# Patient Record
Sex: Female | Born: 1995 | Race: White | Hispanic: No | Marital: Single | Smoking: Former smoker
Health system: Southern US, Community
[De-identification: ages and names within clinical notes are randomized; demographics above are authoritative.]

## PROBLEM LIST (undated history)

## (undated) DIAGNOSIS — Q512 Other doubling of uterus, unspecified: Secondary | ICD-10-CM

## (undated) DIAGNOSIS — T8859XA Other complications of anesthesia, initial encounter: Secondary | ICD-10-CM

## (undated) DIAGNOSIS — R0600 Dyspnea, unspecified: Secondary | ICD-10-CM

## (undated) DIAGNOSIS — I499 Cardiac arrhythmia, unspecified: Secondary | ICD-10-CM

## (undated) DIAGNOSIS — Q5128 Other doubling of uterus, other specified: Secondary | ICD-10-CM

## (undated) DIAGNOSIS — N390 Urinary tract infection, site not specified: Secondary | ICD-10-CM

## (undated) HISTORY — DX: Urinary tract infection, site not specified: N39.0

## (undated) HISTORY — PX: OTHER SURGICAL HISTORY: SHX169

---
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
## 2015-01-01 ENCOUNTER — Encounter: Payer: Self-pay | Admitting: Pediatrics

## 2015-01-01 ENCOUNTER — Ambulatory Visit: Payer: Self-pay | Admitting: Pediatrics

## 2015-01-01 NOTE — Progress Notes (Signed)
Pre-Visit Planning  STI screen in the past year? yes, positive chlamydia 11/13/14 Pertinent Labs? yes,  Component     Latest Ref Rng 11/15/2014  HIV     NONREACTIVE NONREACTIVE  RPR     NON REAC NON REAC  T vaginalis RNA      Negative  Organism ID, Bacteria      HERPES SIMPLEX VIRUS TYPE 1 DETECTED.   Review of previous notes:  Last seen in [facility] on 11/15/14.  Treatment plan at last visit included treatment of positive chlamydia, testing for possible HSV due presence of lesions on mons area, testing for all other STIs.  HSV culture was positive for Type 1.  Kristy Sanchez was notified by telephone.   Previous Psych Screenings?  Yes, PHQ9 06/15/14  Psych Screenings Due? no  Immunizations Due? no  To Do at visit:   - repeat urine GC/CT - consider UHCG - assess for any new HSV lesions - review dyspareunia and review ultrasound confirmed presence of bicornuate uterus - review birth control options

## 2015-01-08 ENCOUNTER — Telehealth: Payer: Self-pay

## 2015-01-08 NOTE — Telephone Encounter (Signed)
-----   Message from Cain SieveMartha Fairbanks Perry, MD sent at 01/08/2015 12:52 PM EST ----- Please call patient to schedule an appointment after she no showed recently.  Please document the call in epic.  Thanks.

## 2015-01-08 NOTE — Telephone Encounter (Signed)
[facility] and told her who I was and why I was calling and she asked if she could call me back.  I gave her my direct line number and asked that she call and make a follow up appointment with Dr. Marina GoodellPerry or Rayfield Citizenaroline.

## 2015-01-31 ENCOUNTER — Encounter: Payer: Self-pay | Admitting: Pediatrics

## 2015-01-31 NOTE — Progress Notes (Signed)
Pre-Visit Planning  Review of previous notes:  Last seen in [facility] on 11/15/14.  Treatment plan at last visit included treatment for chlamydia, STD testing including HSV culture.  Pt tested positive for HSV and was notified.   Previous Psych Screenings?  yes, PHQ9 06/15/14 Psych Screenings Due? no  STI screen in the past year? yes, but repeat indicated due to previous positive Pertinent Labs? yes,  Component     Latest Ref Rng 11/13/2014 11/15/2014  CT Probe RNA      POSITIVE (A)   GC Probe RNA      NEGATIVE   Preg Test, Ur      Negative   HIV     NONREACTIVE  NONREACTIVE  RPR     NON REAC  NON REAC  T vaginalis RNA       Negative  Organism ID, Bacteria       HERPES SIMPLEX VIRUS TYPE 1 DETECTED.   To Do at visit:   - Urine GC/CT - UHCG - Review ultrasound showing bicornuate uterus (pt had wanted this confirmed) - Review birth control options

## 2015-02-01 ENCOUNTER — Encounter: Payer: Self-pay | Admitting: Pediatrics

## 2015-02-01 ENCOUNTER — Ambulatory Visit (INDEPENDENT_AMBULATORY_CARE_PROVIDER_SITE_OTHER): Payer: Medicaid Other | Admitting: Pediatrics

## 2015-02-01 VITALS — BP 116/72 | Ht 65.5 in | Wt 128.0 lb

## 2015-02-01 DIAGNOSIS — Z3202 Encounter for pregnancy test, result negative: Secondary | ICD-10-CM

## 2015-02-01 DIAGNOSIS — Z113 Encounter for screening for infections with a predominantly sexual mode of transmission: Secondary | ICD-10-CM

## 2015-02-01 DIAGNOSIS — A749 Chlamydial infection, unspecified: Secondary | ICD-10-CM

## 2015-02-01 DIAGNOSIS — A6000 Herpesviral infection of urogenital system, unspecified: Secondary | ICD-10-CM

## 2015-02-01 DIAGNOSIS — F4321 Adjustment disorder with depressed mood: Secondary | ICD-10-CM

## 2015-02-01 LAB — POCT URINE PREGNANCY: PREG TEST UR: NEGATIVE

## 2015-02-01 NOTE — Progress Notes (Addendum)
Adolescent Medicine Consultation Follow-Up Visit  Kristy Sanchez  is a 19 y.o. female referred by Dr. Leron Croak here today for follow-up of STD testing and BC options.   PCP Confirmed?  yes  ETTEFAGH, Betti Cruz, MD   History was provided by the patient.  Previsit planning completed:  yes   Patient's private number: [phone]  Last seen in [facility] on 11/15/14. Treatment plan at last visit included treatment for chlamydia, STD testing including HSV culture. Pt tested positive for HSV 1 and was notified.   Growth Chart Viewed? yes - appears to have lost 7 lbs since last visit, likely related to stress   HPI:    Patient states she has tried depo, nuvo ring and pill in the past for birth control options. Depo gave her a period for 3 years straight, every day. When patient was on her nuvo ring, it fell out. When patient was on the pill, it was hard for her to remember to take. Last time took the pill was 5 months ago.  Patient's last menstrual period was 01/23/2015 (approximate).  Has been normal, comes every month.   Allergies  Allergen Reactions  . Hydrocodone Nausea And Vomiting  . Vicodin [Hydrocodone-Acetaminophen] Nausea And Vomiting    Social History: School/Future Plans: Has graduated, working until can get into school. Working at [employer] Living in own place.   Confidentiality was discussed with the patient, at appointment alone.  Sexually active? yes, 1 week ago. Was with ex boyfriend. Was going to work things out but couldn't. Currently plans to not be sexually active in the future due to recent HSV diagnosis.  Pregnancy Prevention: Condoms, patient is not open to any more BC options as she has heard negative stories about all of them  Safe to self? Yes, no SI or HI but has had feelings of depression recently due to HSV diagnosis. Doesn't really have anyone to talk to. Has been holding things inside mostly and has been feeling down. Dad has a history of  depression and thinks she may be experiencing some of it as well.   Review of Symptoms: General ROS: positive for - weight loss Gastrointestinal ROS: no abdominal pain, change in bowel habits, or black or bloody stools Urinary ROS: no dysuria, trouble voiding or hematuria Gyn ROS: negative for - genital discharge, irregular/heavy menses, pelvic pain and vulvar/vaginal symptoms  Physical Exam:  Filed Vitals:   02/01/15 1340  BP: 116/72  Height: 5' 5.5" (1.664 m)  Weight: 128 lb (58.06 kg)   BP 116/72 mmHg  Ht 5' 5.5" (1.664 m)  Wt 128 lb (58.06 kg)  BMI 20.97 kg/m2  LMP 01/23/2015 (Approximate) Body mass index: body mass index is 20.97 kg/(m^2). Blood pressure percentiles are 64% systolic and 70% diastolic based on 2000 NHANES data. Blood pressure percentile targets: 90: 126/80, 95: 129/84, 99 + 5 mmHg: 142/97.  Physical Exam  Gen:  Well-appearing, in no acute distress. Sitting on exam table. Nasal and tongue ring present.  HEENT:  Normocephalic, atraumatic. EOMI. MMM. Neck supple, no lymphadenopathy.   CV: Regular rate and rhythm, no murmurs rubs or gallops. PULM: Clear to auscultation bilaterally. No wheezes/rales or rhonchi ABD: Soft, non tender, non distended, normal bowel sounds.  Skin: Warm, dry, no rashes  Assessment/Plan: Patient is a 19 year old female with a history of HSV and treated chlamydia who comes for follow up. Patient is having a difficult time dealing with diagnosis of HSV but no current SI or HI.  Patient will benefit from talking with someone, spoke with MaliJasmine (behavioral health counselor) about meeting with patient who is agreeable. Also discussed importance of patient having own person at home to talk to.  1. Adjustment disorder with depressed mood - Ambulatory referral to Social Work - weight loss is likely due to stress as well, will FU at next visit   2. Previous chlamydia infection Previous infection on 11/17, treated Retested today, will FU on  results of that and Gonorrhea Discussed the importance of condoms for protection. Urine pregnancy test negative today. Patient declined any other forms of BC, educated on different options.  3. Genital herpes simplex type 1 infection Given information from CDC about infection for education benefit   4. History of bicornuate uterus - Reviewed with patient ultrasound showing bicornuate uterus  Follow-up:  3 months

## 2015-02-01 NOTE — Progress Notes (Signed)
Attending Co-Signature.  I saw and evaluated the patient, performing the key elements of the service.  I developed the management plan that is described in the resident's note, and I agree with the content.  19 yo female s/p chlamydia treatment and positive HSV.  Ready to review to birth control options.  Was not satisfied with depo, pill, or ring.  Declines birth control except for condoms.  Review HSV1.  Struggling with diagnosis of HSV.  Weight loss noted.  She agrees it is stress related.  Reviewed pelvic ultrasound. - Urine HCG NEG - Urine GC/CT, urine trichomonas ordered - Patient and/or legal guardian verbally consented to meet with Behavioral Health Clinician about presenting concerns.   Cain SievePERRY, Oveda Dadamo FAIRBANKS, MD Adolescent Medicine Specialist

## 2015-02-02 ENCOUNTER — Encounter: Payer: Self-pay | Admitting: Pediatrics

## 2015-02-02 LAB — GC/CHLAMYDIA PROBE AMP, URINE
Chlamydia, Swab/Urine, PCR: NEGATIVE
GC Probe Amp, Urine: NEGATIVE

## 2015-02-05 ENCOUNTER — Institutional Professional Consult (permissible substitution): Payer: Medicaid Other | Admitting: Clinical

## 2015-02-05 ENCOUNTER — Telehealth: Payer: Self-pay | Admitting: Clinical

## 2015-02-05 NOTE — Telephone Encounter (Signed)
[facility].  This Behavioral Health Clinician left a message to call back with name & contact information.

## 2015-02-11 ENCOUNTER — Telehealth: Payer: Self-pay | Admitting: Clinical

## 2015-02-11 NOTE — Telephone Encounter (Signed)
No answer. This Behavioral Health Clinician left a message to call back with name & contact information if she wants to reschedule or wants information.  PLAN: At this time, [facility] will wait for patient to call back or follow up with patient at her next follow up visit with Dr. Marina GoodellPerry in May 2016.

## 2015-02-11 NOTE — Telephone Encounter (Signed)
[facility]ancie [facility] back and asked to speak with Dr. Marina GoodellPerry.  [facility] informed her that Dr. Marina GoodellPerry would not be available until Thursday of this week.  [facility] if [facility] could help her or schedule an appointment to discuss strategies with her.  [place] declined scheduling an appointment and stated she's doing better.  Takina asked if Dr. Marina GoodellPerry could call her back when she returns but did not want to leave any detailed information.  [facility] did offer to have her talk to C. Maxwell CaulHacker but [place] wanted to talk to Dr. Marina GoodellPerry directly.

## 2015-02-16 ENCOUNTER — Encounter (HOSPITAL_COMMUNITY): Payer: Self-pay | Admitting: *Deleted

## 2015-02-16 ENCOUNTER — Emergency Department (HOSPITAL_COMMUNITY)
Admission: EM | Admit: 2015-02-16 | Discharge: 2015-02-16 | Disposition: A | Discharge status: Home / Self Care | Payer: Medicaid Other | Attending: Emergency Medicine | Admitting: Emergency Medicine

## 2015-02-16 ENCOUNTER — Emergency Department (HOSPITAL_COMMUNITY): Payer: Medicaid Other

## 2015-02-16 DIAGNOSIS — Z87440 Personal history of urinary (tract) infections: Secondary | ICD-10-CM | POA: Diagnosis not present

## 2015-02-16 DIAGNOSIS — R102 Pelvic and perineal pain: Secondary | ICD-10-CM

## 2015-02-16 DIAGNOSIS — Z3202 Encounter for pregnancy test, result negative: Secondary | ICD-10-CM | POA: Diagnosis not present

## 2015-02-16 DIAGNOSIS — N9489 Other specified conditions associated with female genital organs and menstrual cycle: Secondary | ICD-10-CM | POA: Diagnosis not present

## 2015-02-16 DIAGNOSIS — Z87891 Personal history of nicotine dependence: Secondary | ICD-10-CM | POA: Insufficient documentation

## 2015-02-16 LAB — URINALYSIS, ROUTINE W REFLEX MICROSCOPIC
Bilirubin Urine: NEGATIVE
Glucose, UA: NEGATIVE mg/dL
HGB URINE DIPSTICK: NEGATIVE
Ketones, ur: NEGATIVE mg/dL
Leukocytes, UA: NEGATIVE
NITRITE: NEGATIVE
PROTEIN: NEGATIVE mg/dL
Specific Gravity, Urine: 1.024 (ref 1.005–1.030)
Urobilinogen, UA: 1 mg/dL (ref 0.0–1.0)
pH: 6.5 (ref 5.0–8.0)

## 2015-02-16 LAB — POC URINE PREG, ED: PREG TEST UR: NEGATIVE

## 2015-02-16 LAB — WET PREP, GENITAL
Clue Cells Wet Prep HPF POC: NONE SEEN
TRICH WET PREP: NONE SEEN
Yeast Wet Prep HPF POC: NONE SEEN

## 2015-02-16 MED ORDER — OXYCODONE-ACETAMINOPHEN 5-325 MG PO TABS: 1.0000 | ORAL_TABLET | Freq: Four times a day (QID) | ORAL | Status: DC | PRN

## 2015-02-16 NOTE — Discharge Instructions (Signed)
Pelvic Pain Female pelvic pain can be caused by many different things and start from a variety of places. Pelvic pain refers to pain that is located in the lower half of the abdomen and between your hips. The pain may occur over a short period of time (acute) or may be reoccurring (chronic). The cause of pelvic pain may be related to disorders affecting the female reproductive organs (gynecologic), but it may also be related to the bladder, kidney stones, an intestinal complication, or muscle or skeletal problems. Getting help right away for pelvic pain is important, especially if there has been severe, sharp, or a sudden onset of unusual pain. It is also important to get help right away because some types of pelvic pain can be life threatening.  CAUSES  Below are only some of the causes of pelvic pain. The causes of pelvic pain can be in one of several categories.   Gynecologic.  Pelvic inflammatory disease.  Sexually transmitted infection.  Ovarian cyst or a twisted ovarian ligament (ovarian torsion).  Uterine lining that grows outside the uterus (endometriosis).  Fibroids, cysts, or tumors.  Ovulation.  Pregnancy.  Pregnancy that occurs outside the uterus (ectopic pregnancy).  Miscarriage.  Labor.  Abruption of the placenta or ruptured uterus.  Infection.  Uterine infection (endometritis).  Bladder infection.  Diverticulitis.  Miscarriage related to a uterine infection (septic abortion).  Bladder.  Inflammation of the bladder (cystitis).  Kidney stone(s).  Gastrointestinal.  Constipation.  Diverticulitis.  Neurologic.  Trauma.  Feeling pelvic pain because of mental or emotional causes (psychosomatic).  Cancers of the bowel or pelvis. EVALUATION  Your caregiver will want to take a careful history of your concerns. This includes recent changes in your health, a careful gynecologic history of your periods (menses), and a sexual history. Obtaining your family  history and medical history is also important. Your caregiver may suggest a pelvic exam. A pelvic exam will help identify the location and severity of the pain. It also helps in the evaluation of which organ system may be involved. In order to identify the cause of the pelvic pain and be properly treated, your caregiver may order tests. These tests may include:   A pregnancy test.  Pelvic ultrasonography.  An X-ray exam of the abdomen.  A urinalysis or evaluation of vaginal discharge.  Blood tests. HOME CARE INSTRUCTIONS   Only take over-the-counter or prescription medicines for pain, discomfort, or fever as directed by your caregiver.   Rest as directed by your caregiver.   Eat a balanced diet.   Drink enough fluids to make your urine clear or pale yellow, or as directed.   Avoid sexual intercourse if it causes pain.   Apply warm or cold compresses to the lower abdomen depending on which one helps the pain.   Avoid stressful situations.   Keep a journal of your pelvic pain. Write down when it started, where the pain is located, and if there are things that seem to be associated with the pain, such as food or your menstrual cycle.  Follow up with your caregiver as directed.  SEEK MEDICAL CARE IF:  Your medicine does not help your pain.  You have abnormal vaginal discharge. SEEK IMMEDIATE MEDICAL CARE IF:   You have heavy bleeding from the vagina.   Your pelvic pain increases.   You feel light-headed or faint.   You have chills.   You have pain with urination or blood in your urine.   You have uncontrolled diarrhea   or vomiting.   You have a fever or persistent symptoms for more than 3 days.  You have a fever and your symptoms suddenly get worse.   You are being physically or sexually abused.  MAKE SURE YOU:  Understand these instructions.  Will watch your condition.  Will get help if you are not doing well or get worse. Document Released:  11/10/2004 Document Revised: 04/30/2014 Document Reviewed: 04/04/2012 ExitCare Patient Information 2015 [employer]. This information is not intended to replace advice given to you by your health care provider. Make sure you discuss any questions you have with your health care provider.  

## 2015-02-16 NOTE — ED Notes (Signed)
Pt reports unable to obtain urine sample at triage. 

## 2015-02-16 NOTE — ED Notes (Signed)
Pt reports lower abd pain, n/v and feeling lightheaded. Unsure of last menstrual. Pt denies any vaginal or urinary symptoms.

## 2015-02-16 NOTE — ED Provider Notes (Signed)
CSN: [id number]     Arrival date & time 02/16/15  1255 History   First MD Initiated Contact with Patient 02/16/15 1317     Chief Complaint  Patient presents with  . Pelvic Pain  . Nausea      HPI Pt was seen at 1330.  Per pt, c/o gradual onset and persistence of constant pelvic "pain" for the past 4 days. Has been associated with nausea and a small amount of vaginal bleeding. Pt states she has irregular menses and "it's sure if that's what it is." Describes the abd pain as "cramping."  Denies vomiting/diarrhea, no fevers, no back pain, no rash, no CP/SOB, no black or blood in stools.       Past Medical History  Diagnosis Date  . 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 UTI (urinary tract infection) 04/23/2014    85K CFU's of Klebsiella, treated with Augmentin x 10 days    Past Surgical History  Procedure Laterality Date  . Dental surgery  03/23/14    wisdom teeth extraction   Family History  Problem Relation Age of Onset  . Dementia Father   . Depression Father   . Cancer Paternal Uncle   . Cancer Maternal Grandmother     leukemia  . Heart disease Paternal Grandmother     arrhythmia  . Diabetes Paternal Grandfather    History  Substance Use Topics  . Smoking status: Former Smoker    Types: Cigarettes  . Smokeless tobacco: Not on file     Comment: ttrying to stop  . Alcohol Use: Not on file    Review of Systems ROS: Statement: All systems negative except as marked or noted in the HPI; Constitutional: Negative for fever and chills. ; ; Eyes: Negative for eye pain, redness and discharge. ; ; ENMT: Negative for ear pain, hoarseness, nasal congestion, sinus pressure and sore throat. ; ; Cardiovascular: Negative for chest pain, palpitations, diaphoresis, dyspnea and peripheral edema. ; ; Respiratory: Negative for cough, wheezing and stridor. ; ; Gastrointestinal: +nausea. Negative for vomiting, no diarrhea, abdominal  pain, blood in stool, hematemesis, jaundice and rectal bleeding. . ; ; Genitourinary: Negative for dysuria, flank pain and hematuria. ; ; GYN:  +pelvic pain, +vaginal bleeding. No vaginal discharge, no vulvar pain. ;; Musculoskeletal: Negative for back pain and neck pain. Negative for swelling and trauma.; ; Skin: Negative for pruritus, rash, abrasions, blisters, bruising and skin lesion.; ; Neuro: Negative for headache, lightheadedness and neck stiffness. Negative for weakness, altered level of consciousness , altered mental status, extremity weakness, paresthesias, involuntary movement, seizure and syncope.      Allergies  Hydrocodone and Vicodin  Home Medications   Prior to Admission medications   Medication Sig Start Date End Date Taking? Authorizing Provider  mupirocin ointment (BACTROBAN) 2 % Apply 1 application topically 2 (two) times daily. Use until razor burn is gone, then restart as needed. 11/13/14   Cain Sieve, MD   BP 112/65 mmHg  Pulse 76  Temp(Src) 98.6 F (37 C) (Oral)  Resp 10  Ht  (1.651 m)  Wt 120 lb (54.432 kg)  BMI 19.97 kg/m2  SpO2 99%  LMP 01/23/2015 (LMP Unknown) Physical Exam  1335: Physical examination:  Nursing notes reviewed; Vital signs and O2 SAT reviewed;  Constitutional: Well developed, Well nourished, Well hydrated, In no acute distress; Head:  Normocephalic, atraumatic; Eyes: EOMI, PERRL, No scleral icterus; ENMT: Mouth and  pharynx normal, Mucous membranes moist; Neck: Supple, Full range of motion, No lymphadenopathy; Cardiovascular: Regular rate and rhythm, No murmur, rub, or gallop; Respiratory: Breath sounds clear & equal bilaterally, No rales, rhonchi, wheezes.  Speaking full sentences with ease, Normal respiratory effort/excursion; Chest: Nontender, Movement normal; Abdomen: Soft, +mild suprapubic tenderness to palp. No rebound or guarding. Nondistended, Normal bowel sounds; Genitourinary: No CVA tenderness. Pelvic exam performed with  permission of pt and female ED RN assist during exam.  External genitalia w/o lesions. Vaginal vault without discharge, small amount of dark red blood in vault.  Cervix w/o lesions, not friable, GC/chlam and wet prep obtained and sent to lab.  Bimanual exam w/o CMT. +mild suprapubic and bilateral adnexal tenderness.;; Extremities: Pulses normal, No tenderness, No edema, No calf edema or asymmetry.; Neuro: AA&Ox3, Major CN grossly intact.  Speech clear. No gross focal motor or sensory deficits in extremities. Climbs on and off stretcher easily by herself. Gait steady.; Skin: Color normal, Warm, Dry.   ED Course  Procedures     EKG Interpretation None      MDM  MDM Reviewed: previous chart, nursing note and vitals Reviewed previous: labs Interpretation: labs   Results for orders placed or performed during the hospital encounter of 02/16/15  Wet prep, genital  Result Value Ref Range   Yeast Wet Prep HPF POC NONE SEEN NONE SEEN   Trich, Wet Prep NONE SEEN NONE SEEN   Clue Cells Wet Prep HPF POC NONE SEEN NONE SEEN   WBC, Wet Prep HPF POC FEW (A) NONE SEEN  Urinalysis, Routine w reflex microscopic  Result Value Ref Range   Color, Urine YELLOW YELLOW   APPearance CLEAR CLEAR   Specific Gravity, Urine 1.024 1.005 - 1.030   pH 6.5 5.0 - 8.0   Glucose, UA NEGATIVE NEGATIVE mg/dL   Hgb urine dipstick NEGATIVE NEGATIVE   Bilirubin Urine NEGATIVE NEGATIVE   Ketones, ur NEGATIVE NEGATIVE mg/dL   Protein, ur NEGATIVE NEGATIVE mg/dL   Urobilinogen, UA 1.0 0.0 - 1.0 mg/dL   Nitrite NEGATIVE NEGATIVE   Leukocytes, UA NEGATIVE NEGATIVE  POC Urine Pregnancy, ED  (If Pre-menopausal female) - [facility]  Result Value Ref Range   Preg Test, Ur NEGATIVE NEGATIVE     1620:  US pelvis pending. Sign out to Dr. Gwendolyn Sanchez.       Kristy JesterKathleen Avangelina Sanchez, Kristy Sanchez 02/16/15 1621

## 2015-02-16 NOTE — ED Notes (Signed)
Patient transported to Ultrasound 

## 2015-02-16 NOTE — ED Provider Notes (Signed)
1600 - Care from Dr. Clarene DukeMcManus. Here with pelvic pain on exam. Awaiting US. US shows pelvic congestion syndrome. Will have her f/u with OB. Given pain meds. No evidence of torsion  1. Pelvic pain in female      Elwin MochaBlair Janeisha Ryle, MD 02/16/15 [phone]

## 2015-02-18 LAB — GC/CHLAMYDIA PROBE AMP (~~LOC~~) NOT AT ARMC
Chlamydia: NEGATIVE
NEISSERIA GONORRHEA: NEGATIVE

## 2015-02-19 NOTE — Telephone Encounter (Signed)
Spoke with patient who stated she would like to schedule f/u after recent ER visit and to discuss other issues previously addressed at visits.  Pt given appt for Feb 29 at 8:30 AM.

## 2015-02-25 ENCOUNTER — Ambulatory Visit: Payer: Medicaid Other | Admitting: Pediatrics

## 2015-02-25 ENCOUNTER — Telehealth: Payer: Self-pay

## 2015-02-25 NOTE — Telephone Encounter (Signed)
Pt missed the appt that was scheduled this morning at 8:30 AM.  Please schedule her with next available appt and put her on waiting list for cancellations.

## 2015-02-25 NOTE — Telephone Encounter (Signed)
Pt called this morning and would like to see you today.

## 2015-02-25 NOTE — Telephone Encounter (Signed)
Pt called back, VM left asking pt to call and schedule a f/u apt with Dr. Marina GoodellPerry. Advised that we will put her on a  waiting list for cancellations. Callback number provided.

## 2015-03-20 ENCOUNTER — Ambulatory Visit (INDEPENDENT_AMBULATORY_CARE_PROVIDER_SITE_OTHER): Payer: Medicaid Other | Admitting: Pediatrics

## 2015-03-20 ENCOUNTER — Encounter: Payer: Self-pay | Admitting: Pediatrics

## 2015-03-20 ENCOUNTER — Encounter (INDEPENDENT_AMBULATORY_CARE_PROVIDER_SITE_OTHER): Payer: Self-pay

## 2015-03-20 VITALS — BP 114/64 | Ht 65.59 in | Wt 125.8 lb

## 2015-03-20 DIAGNOSIS — Q513 Bicornate uterus: Secondary | ICD-10-CM | POA: Diagnosis not present

## 2015-03-20 DIAGNOSIS — G8929 Other chronic pain: Secondary | ICD-10-CM

## 2015-03-20 DIAGNOSIS — Z111 Encounter for screening for respiratory tuberculosis: Secondary | ICD-10-CM

## 2015-03-20 DIAGNOSIS — N926 Irregular menstruation, unspecified: Secondary | ICD-10-CM | POA: Diagnosis not present

## 2015-03-20 DIAGNOSIS — M542 Cervicalgia: Secondary | ICD-10-CM

## 2015-03-20 DIAGNOSIS — N949 Unspecified condition associated with female genital organs and menstrual cycle: Secondary | ICD-10-CM | POA: Diagnosis not present

## 2015-03-20 DIAGNOSIS — R102 Pelvic and perineal pain: Principal | ICD-10-CM

## 2015-03-20 MED ORDER — TUBERCULIN PPD 5 UNIT/0.1ML ID SOLN
5.0000 [IU] | Freq: Once | INTRADERMAL | Status: AC
  Administered 2015-03-20: 5 [IU] via INTRADERMAL

## 2015-03-20 NOTE — Patient Instructions (Addendum)
We have placed your TB tests today in both arms. Come back to see Melissa on Friday to have them read. If you miss coming on Friday, we will have to repeat the process over again.   You already had your flu shot this year and we provided you with the documentation.   We will refer you to Dr. Renae FicklePaul at [facility] for your neck and back pain. He can usually get people in fairly quickly.
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
## 2015-03-22 ENCOUNTER — Ambulatory Visit: Payer: Medicaid Other | Admitting: *Deleted

## 2015-03-22 ENCOUNTER — Encounter (INDEPENDENT_AMBULATORY_CARE_PROVIDER_SITE_OTHER): Payer: Self-pay

## 2015-03-22 ENCOUNTER — Ambulatory Visit: Payer: Self-pay | Admitting: *Deleted

## 2015-03-22 LAB — TB SKIN TEST
Induration: 0 mm
Induration: 2.5 mm
TB SKIN TEST: NEGATIVE
TB Skin Test: NEGATIVE

## 2015-03-22 NOTE — Addendum Note (Signed)
Addended by: Elenora GammaFEENY, Adrinne Sze E on: 03/22/2015 04:44 PM   Modules accepted: Orders

## 2015-04-08 ENCOUNTER — Telehealth: Payer: Self-pay | Admitting: *Deleted

## 2015-04-08 NOTE — Telephone Encounter (Signed)
Kristy Sanchez called in and needed a different time for her appointment tomorrow 4/12, she was advised we did not have anything until later this month. She needs an appointment sooner than the May 10th one available with Dr. Marina GoodellPerry. Please call her [phone].

## 2015-04-09 ENCOUNTER — Ambulatory Visit: Payer: Medicaid Other | Admitting: Pediatrics

## 2015-05-02 ENCOUNTER — Encounter: Payer: Medicaid Other | Admitting: Clinical

## 2015-05-02 ENCOUNTER — Ambulatory Visit: Payer: Medicaid Other | Admitting: Pediatrics

## 2015-05-07 ENCOUNTER — Encounter: Payer: Self-pay | Admitting: Pediatrics

## 2015-05-07 ENCOUNTER — Ambulatory Visit (INDEPENDENT_AMBULATORY_CARE_PROVIDER_SITE_OTHER): Payer: Medicaid Other | Admitting: Pediatrics

## 2015-05-07 ENCOUNTER — Encounter (INDEPENDENT_AMBULATORY_CARE_PROVIDER_SITE_OTHER): Payer: Self-pay

## 2015-05-07 VITALS — BP 108/70 | Ht 66.14 in | Wt 127.4 lb

## 2015-05-07 DIAGNOSIS — Z3202 Encounter for pregnancy test, result negative: Secondary | ICD-10-CM | POA: Diagnosis not present

## 2015-05-07 DIAGNOSIS — R102 Pelvic and perineal pain: Secondary | ICD-10-CM

## 2015-05-07 DIAGNOSIS — Z3046 Encounter for surveillance of implantable subdermal contraceptive: Secondary | ICD-10-CM | POA: Insufficient documentation

## 2015-05-07 DIAGNOSIS — Z113 Encounter for screening for infections with a predominantly sexual mode of transmission: Secondary | ICD-10-CM | POA: Diagnosis not present

## 2015-05-07 DIAGNOSIS — Z309 Encounter for contraceptive management, unspecified: Secondary | ICD-10-CM

## 2015-05-07 DIAGNOSIS — Z30430 Encounter for insertion of intrauterine contraceptive device: Secondary | ICD-10-CM

## 2015-05-07 DIAGNOSIS — G8929 Other chronic pain: Secondary | ICD-10-CM

## 2015-05-07 DIAGNOSIS — Z789 Other specified health status: Secondary | ICD-10-CM

## 2015-05-07 DIAGNOSIS — Z30017 Encounter for initial prescription of implantable subdermal contraceptive: Secondary | ICD-10-CM

## 2015-05-07 DIAGNOSIS — Q513 Bicornate uterus: Secondary | ICD-10-CM

## 2015-05-07 LAB — POCT URINE PREGNANCY: PREG TEST UR: NEGATIVE

## 2015-05-07 LAB — POCT RAPID HIV: RAPID HIV, POC: NEGATIVE

## 2015-05-07 MED ORDER — NON FORMULARY
1.5000 mg | Freq: Once | Status: AC
  Administered 2015-05-07: 1.5 mg via ORAL

## 2015-05-07 MED ORDER — ETONOGESTREL 68 MG ~~LOC~~ IMPL
68.0000 mg | DRUG_IMPLANT | Freq: Once | SUBCUTANEOUS | Status: AC
  Administered 2015-05-07: 68 mg via SUBCUTANEOUS
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
## 2015-05-07 NOTE — Procedures (Signed)
Nexplanon Insertion  No contraindications for placement.  No liver disease, no unexplained vaginal bleeding, no h/o breast cancer, no h/o blood clots.  Patient's last menstrual period was 04/29/2015 (approximate).  UHCG: NEG  Last Unprotected sex:  <24 hours, therefore Plan B administered in clinic  Risks & benefits of Nexplanon discussed The nexplanon device was purchased and [facility] by [facility]. Packaging instructions supplied to patient Consent form signed  The patient denies any allergies to anesthetics or antiseptics.  Procedure: Pt was placed in supine position. Left arm was flexed at the elbow and externally rotated so that her wrist was parallel to her ear The medial epicondyle of the left arm was identified The insertions site was marked 8 cm proximal to the medial epicondyle The insertion site was cleaned with Betadine The area surrounding the insertion site was covered with a sterile drape 1% lidocaine was injected just under the skin at the insertion site extending 4 cm proximally. The sterile preloaded disposable Nexaplanon applicator was removed from the sterile packaging The applicator needle was inserted at a 30 degree angle at 8 cm proximal to the medial epicondyle as marked The applicator was lowered to a horizontal position and advanced just under the skin for the full length of the needle The slider on the applicator was retracted fully while the applicator remained in the same position, then the applicator was removed. The implant was confirmed via palpation as being in position The implant position was demonstrated to the patient Pressure dressing was applied to the patient.  The patient was instructed to removed the pressure dressing in 24 hrs.  The patient was advised to move slowly from a supine to an upright position  The patient denied any concerns or complaints  The patient was instructed to schedule a follow-up appt in 1 month and to call sooner if  any concerns.  The patient acknowledged agreement and understanding of the plan.

## 2015-05-07 NOTE — Patient Instructions (Signed)
Follow-up with Dr. Ailany Koren in 1 month. Schedule this appointment before you leave clinic today.  Congratulations on getting your Nexplanon placement!  Below is some important information about Nexplanon.  First remember that Nexplanon does not prevent sexually transmitted infections.  Condoms will help prevent sexually transmitted infections. The Nexplanon starts working 7 days after it was inserted.  There is a risk of getting pregnant if you have unprotected sex in those first 7 days after placement of the Nexplanon.  The Nexplanon lasts for 3 years but can be removed at any time.  You can become pregnant as early as 1 week after removal.  You can have a new Nexplanon put in after the old one is removed if you like.  It is not known whether Nexplanon is as effective in women who are very overweight because the studies did not include many overweight women.  Nexplanon interacts with some medications, including barbiturates, bosentan, carbamazepine, felbamate, griseofulvin, oxcarbazepine, phenytoin, rifampin, St. John's wort, topiramate, HIV medicines.  Please alert your doctor if you are on any of these medicines.  Always tell other healthcare providers that you have a Nexplanon in your arm.  The Nexplanon was placed just under the skin.  Leave the outside bandage on for 24 hours.  Leave the smaller bandage on for 3-5 days or until it falls off on its own.  Keep the area clean and dry for 3-5 days. There is usually bruising or swelling at the insertion site for a few days to a week after placement.  If you see redness or pus draining from the insertion site, call us immediately.  Keep your user card with the date the implant was placed and the date the implant is to be removed.  The most common side effect is a change in your menstrual bleeding pattern.   This bleeding is generally not harmful to you but can be annoying.  Call or come in to see us if you have any concerns about the bleeding or if  you have any side effects or questions.    We will call you in 1 week to check in and we would like you to return to the clinic for a follow-up visit in 1 month.  You can call [facility] 24 hours a day with any questions or concerns.  There is always a nurse or doctor available to take your call.  Call 9-1-1 if you have a life-threatening emergency.  For anything else, please call us at [phone] before heading to the ER.  

## 2015-05-08 ENCOUNTER — Encounter: Payer: Self-pay | Admitting: Pediatrics

## 2015-05-08 LAB — GC/CHLAMYDIA PROBE AMP, URINE
Chlamydia, Swab/Urine, PCR: NEGATIVE
GC Probe Amp, Urine: NEGATIVE

## 2015-05-08 LAB — RPR

## 2015-05-21 ENCOUNTER — Encounter: Payer: Self-pay | Admitting: Family Medicine

## 2015-05-21 ENCOUNTER — Ambulatory Visit (INDEPENDENT_AMBULATORY_CARE_PROVIDER_SITE_OTHER): Payer: Medicaid Other | Admitting: Family Medicine

## 2015-05-21 VITALS — BP 106/64 | HR 72 | Wt 130.0 lb

## 2015-05-21 DIAGNOSIS — R102 Pelvic and perineal pain: Principal | ICD-10-CM

## 2015-05-21 DIAGNOSIS — G8929 Other chronic pain: Secondary | ICD-10-CM | POA: Diagnosis not present

## 2015-05-21 DIAGNOSIS — N949 Unspecified condition associated with female genital organs and menstrual cycle: Secondary | ICD-10-CM | POA: Diagnosis not present

## 2015-05-21 NOTE — Assessment & Plan Note (Signed)
Lengthy discussion around possible etiologies and treatment options.  Also, since pain is not present right now, might be responding to contraceptive method. Patient strongly desires definitive diagnosis since it tends to wax and wane. Given chronicity, will book for diagnostic laparoscopy to evaluate chronic PID vs. Endometriosis. Also, pt. Is concerned she might have infertility related to pain, and tubes would be able to be assessed visually at time of surgery. If this is negative, could consider interventional radiology for treatment of probable pelvic congestion syndrome.  Risks of surgery include but are not limited to bleeding, infection, injury to surrounding structures, including bowel, bladder and ureters, blood clots, and death.  Likelihood of success is high.

## 2015-05-21 NOTE — Progress Notes (Signed)
    Subjective:    Patient ID: Kristy Sanchez is a 19 y.o. female presenting with Pelvic Pain  on 05/21/2015  HPI: Pt. Is referred to me from Dr. Delorse LekMartha Perry in the adolescent medicine clinic for pelvic pain. Pt. Reports Menarche at age 19. Pain began 1 year ago. Pain comes and goes and is in lower abdomen. Pain is with cycle and without cycle. Possibly less with cycle. Pain is worse when lying down and with certain sexual positions. Pain is now absent x 2 wks.  She has h/o irregular cycles following Depo and had Nexplanon placed 2 wks ago. Hs not had a cycle since getting her Nexplanon placed. Had chlamydia in 11/15, but TOC has been negative.  Denies fever or chills. Occasional nausea but no emesis. Had pelvic sonogram which revealed some vascular congestion on left, but was otherwise normal. Also reports unprotected intercourse and no pregnancy.  She is very interested in obtaining a diagnosis.   Review of Systems  Constitutional: Negative for fever and chills.  Respiratory: Negative for shortness of breath.   Cardiovascular: Negative for chest pain.  Gastrointestinal: Positive for nausea. Negative for vomiting and abdominal pain.  Genitourinary: Positive for menstrual problem and pelvic pain. Negative for dysuria and vaginal bleeding.  Skin: Negative for rash.      Objective:    BP 106/64 mmHg  Pulse 72  Wt 130 lb (58.968 kg)  LMP 04/29/2015 (Approximate) Physical Exam  Constitutional: She is oriented to person, place, and time. She appears well-developed and well-nourished. No distress.  HENT:  Head: Normocephalic and atraumatic.  Eyes: No scleral icterus.  Neck: Neck supple.  Cardiovascular: Normal rate.   Pulmonary/Chest: Effort normal.  Abdominal: Soft.  Genitourinary:  BUS normal, vagina is pink and rugated, cervix is nulliparous without lesion, uterus is small and anteverted, no adnexal mass or tenderness. There is really very little pelvic tenderness.   Neurological:  She is alert and oriented to person, place, and time.  Skin: Skin is warm and dry.  Psychiatric: She has a normal mood and affect.        Assessment & Plan:   Problem List Items Addressed This Visit      Unprioritized   Chronic pelvic pain in female - Primary    Lengthy discussion around possible etiologies and treatment options.  Also, since pain is not present right now, might be responding to contraceptive method. Patient strongly desires definitive diagnosis since it tends to wax and wane. Given chronicity, will book for diagnostic laparoscopy to evaluate chronic PID vs. Endometriosis. Also, pt. Is concerned she might have infertility related to pain, and tubes would be able to be assessed visually at time of surgery. If this is negative, could consider interventional radiology for treatment of probable pelvic congestion syndrome.  Risks of surgery include but are not limited to bleeding, infection, injury to surrounding structures, including bowel, bladder and ureters, blood clots, and death.  Likelihood of success is high.          Return in about 3 months (around 08/21/2015) for postop check.   Freddie Nghiem S 05/21/2015 11:03 AM

## 2015-05-21 NOTE — Patient Instructions (Signed)
Pelvic Pain Female pelvic pain can be caused by many different things and start from a variety of places. Pelvic pain refers to pain that is located in the lower half of the abdomen and between your hips. The pain may occur over a short period of time (acute) or may be reoccurring (chronic). The cause of pelvic pain may be related to disorders affecting the female reproductive organs (gynecologic), but it may also be related to the bladder, kidney stones, an intestinal complication, or muscle or skeletal problems. Getting help right away for pelvic pain is important, especially if there has been severe, sharp, or a sudden onset of unusual pain. It is also important to get help right away because some types of pelvic pain can be life threatening.  CAUSES  Below are only some of the causes of pelvic pain. The causes of pelvic pain can be in one of several categories.   Gynecologic.  Pelvic inflammatory disease.  Sexually transmitted infection.  Ovarian cyst or a twisted ovarian ligament (ovarian torsion).  Uterine lining that grows outside the uterus (endometriosis).  Fibroids, cysts, or tumors.  Ovulation.  Pregnancy.  Pregnancy that occurs outside the uterus (ectopic pregnancy).  Miscarriage.  Labor.  Abruption of the placenta or ruptured uterus.  Infection.  Uterine infection (endometritis).  Bladder infection.  Diverticulitis.  Miscarriage related to a uterine infection (septic abortion).  Bladder.  Inflammation of the bladder (cystitis).  Kidney stone(s).  Gastrointestinal.  Constipation.  Diverticulitis.  Neurologic.  Trauma.  Feeling pelvic pain because of mental or emotional causes (psychosomatic).  Cancers of the bowel or pelvis. EVALUATION  Your caregiver will want to take a careful history of your concerns. This includes recent changes in your health, a careful gynecologic history of your periods (menses), and a sexual history. Obtaining your family  history and medical history is also important. Your caregiver may suggest a pelvic exam. A pelvic exam will help identify the location and severity of the pain. It also helps in the evaluation of which organ system may be involved. In order to identify the cause of the pelvic pain and be properly treated, your caregiver may order tests. These tests may include:   A pregnancy test.  Pelvic ultrasonography.  An X-ray exam of the abdomen.  A urinalysis or evaluation of vaginal discharge.  Blood tests. HOME CARE INSTRUCTIONS   Only take over-the-counter or prescription medicines for pain, discomfort, or fever as directed by your caregiver.   Rest as directed by your caregiver.   Eat a balanced diet.   Drink enough fluids to make your urine clear or pale yellow, or as directed.   Avoid sexual intercourse if it causes pain.   Apply warm or cold compresses to the lower abdomen depending on which one helps the pain.   Avoid stressful situations.   Keep a journal of your pelvic pain. Write down when it started, where the pain is located, and if there are things that seem to be associated with the pain, such as food or your menstrual cycle.  Follow up with your caregiver as directed.  SEEK MEDICAL CARE IF:  Your medicine does not help your pain.  You have abnormal vaginal discharge. SEEK IMMEDIATE MEDICAL CARE IF:   You have heavy bleeding from the vagina.   Your pelvic pain increases.   You feel light-headed or faint.   You have chills.   You have pain with urination or blood in your urine.   You have uncontrolled diarrhea   or vomiting.   You have a fever or persistent symptoms for more than 3 days.  You have a fever and your symptoms suddenly get worse.   You are being physically or sexually abused.  MAKE SURE YOU:  Understand these instructions.  Will watch your condition.  Will get help if you are not doing well or get worse. Document Released:  11/10/2004 Document Revised: 04/30/2014 Document Reviewed: 04/04/2012 ExitCare Patient Information 2015 [employer]. This information is not intended to replace advice given to you by your health care provider. Make sure you discuss any questions you have with your health care provider.  

## 2015-05-26 ENCOUNTER — Encounter: Payer: Self-pay | Admitting: Family Medicine

## 2015-05-26 NOTE — H&P (Signed)
  Kristy Sanchez is an 19 y.o. G0P0000 female.   Chief Complaint: Pelvic pain HPI: Pain began 1 year ago. Pain comes and goes and is in lower abdomen. Pain is with cycle and without cycle. Possibly less with cycle. Pain is worse when lying down and with certain sexual positions. Pain is now absent x 2 wks. She has h/o irregular cycles following Depo and had Nexplanon placed 2 wks ago. Hs not had a cycle since getting her Nexplanon placed. Had chlamydia in 11/15, but TOC has been negative. Denies fever or chills. Occasional nausea but no emesis. Had pelvic sonogram which revealed some vascular congestion on left, but was otherwise normal. Also reports unprotected intercourse and no pregnancy. She is very interested in obtaining a diagnosis.  Past Medical History  Diagnosis Date  . 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 UTI (urinary tract infection) 04/23/2014    85K CFU's of Klebsiella, treated with Augmentin x 10 days     Past Surgical History  Procedure Laterality Date  . Dental surgery  03/23/14    wisdom teeth extraction    Family History  Problem Relation Age of Onset  . Dementia Father   . Depression Father   . Cancer Paternal Uncle   . Cancer Maternal Grandmother     leukemia  . Heart disease Paternal Grandmother     arrhythmia  . Diabetes Paternal Grandfather    Social History:  reports that she has quit smoking. Her smoking use included Cigarettes. She does not have any smokeless tobacco history on file. Her alcohol and drug histories are not on file.  Allergies:  Allergies  Allergen Reactions  . Hydrocodone Nausea And Vomiting  . Vicodin [Hydrocodone-Acetaminophen] Nausea And Vomiting    No current facility-administered medications on file prior to encounter.   No current outpatient prescriptions on file prior to encounter.    A comprehensive review of systems was negative.  There were no vitals  taken for this visit. General appearance: alert, cooperative and appears stated age Head: Normocephalic, without obvious abnormality, atraumatic Neck: supple, symmetrical, trachea midline Lungs: normal effort Heart: regular rate and rhythm Abdomen: soft, non-tender; bowel sounds normal; no masses,  no organomegaly Extremities: extremities normal, atraumatic, no cyanosis or edema Skin: Skin color, texture, turgor normal. No rashes or lesions Neurologic: Grossly normal   Lab Results  Component Value Date   WBC 7.0 04/18/2014   HGB 13.6 04/18/2014   HCT 39.0 04/18/2014   MCV 83.5 04/18/2014   PLT 251 04/18/2014   Lab Results  Component Value Date   PREGTESTUR Negative 05/07/2015     Assessment/Plan Principal Problem:   Chronic pelvic pain in female  For diagnostic laparoscopy. Risks include but are not limited to bleeding, infection, injury to surrounding structures, including bowel, bladder and ureters, blood clots, and death.  Likelihood of success is moderate.  May not be able to locate a diagnosis.Marland Kitchen.   Jashad Depaula S 05/26/2015, 2:06 PM

## 2015-06-05 ENCOUNTER — Other Ambulatory Visit (HOSPITAL_COMMUNITY): Payer: Self-pay

## 2015-06-13 ENCOUNTER — Encounter (INDEPENDENT_AMBULATORY_CARE_PROVIDER_SITE_OTHER): Payer: Self-pay

## 2015-06-13 ENCOUNTER — Encounter: Payer: Self-pay | Admitting: Pediatrics

## 2015-06-13 ENCOUNTER — Ambulatory Visit (INDEPENDENT_AMBULATORY_CARE_PROVIDER_SITE_OTHER): Payer: Medicaid Other | Admitting: Pediatrics

## 2015-06-13 VITALS — BP 108/64 | Ht 64.76 in | Wt 128.4 lb

## 2015-06-13 DIAGNOSIS — Z3009 Encounter for other general counseling and advice on contraception: Secondary | ICD-10-CM

## 2015-06-13 DIAGNOSIS — L700 Acne vulgaris: Secondary | ICD-10-CM

## 2015-06-13 DIAGNOSIS — Z3046 Encounter for surveillance of implantable subdermal contraceptive: Secondary | ICD-10-CM

## 2015-06-13 DIAGNOSIS — Z3202 Encounter for pregnancy test, result negative: Secondary | ICD-10-CM | POA: Diagnosis not present

## 2015-06-13 LAB — POCT URINE PREGNANCY: Preg Test, Ur: NEGATIVE

## 2015-06-13 MED ORDER — TRETINOIN 0.025 % EX GEL: Freq: Every day | CUTANEOUS | Status: DC

## 2015-06-13 NOTE — Patient Instructions (Addendum)
Keep being patient with your nexplanon while we figure out what is going on with your pelvic pain. It may be that your nexplanon is one of the best treatments for it. If the bleeding is still bothersome when you come back, we can talk about next steps to help.   I have sent over retin A gel. If this isn't the acne medicine that worked for you, let us know and we will send over something else.

## 2015-06-13 NOTE — Progress Notes (Signed)
Adolescent Medicine Consultation Follow-Up Visit Kristy Sanchez  is a 19 y.o. female referred by [facility], Betti Cruz, MD here today for follow-up of nexplanon follow-up.   Previsit planning completed:  yes  Growth Chart Viewed? yes  PCP Confirmed?  yes   History was provided by the patient   Some spotty bleeding htat can be heavier. Period stopped at first for about a month right before nexplanon was placed but started back and hasn't stopped yet since. She is very frustrated by this although she did understand that it can take 3-6 months to regulate. She feels "over" birth control and would prefer to go back to using just condoms although her reliability with this in the past has been poor. She is also concerned about not being able to get pregnant in the future. Her pelvic pain has improved with nexplanon, however, she would still like it removed. She understands that if her surgery 06/28/15 diagnoses endometriosis that hormonal contraception is the treatment of choice for this, and that an IUD isn't possible for her given bicorunate uterus. Site has healed well with some occasional soreness.   Has been feeling sick since 2 days ago. Has had some nausea and a headerhe. Denies sore throat, nasal drainage, cough, fever. Has also had some fatigue.   She had an acne cream of her friend's that she was using that worked well, however, she can't remember the name. She would like to have some. Thinks it may be tretinoin.   Patient's last menstrual period was 05/29/2015 (approximate).  The following portions of the patient's history were reviewed and updated as appropriate: allergies, current medications, past family history, past medical history, past social history and problem list.  Review of Systems  Constitutional: Positive for malaise/fatigue. Negative for weight loss.  Eyes: Negative for blurred vision.  Respiratory: Negative for shortness of breath.   Cardiovascular: Negative for chest pain and  palpitations.  Gastrointestinal: Positive for nausea. Negative for vomiting, abdominal pain and constipation.  Genitourinary: Negative for dysuria.  Musculoskeletal: Negative for myalgias.  Neurological: Positive for headaches. Negative for dizziness.  Psychiatric/Behavioral: Negative for depression.     Allergies  Allergen Reactions  . Hydrocodone Nausea And Vomiting  . Vicodin [Hydrocodone-Acetaminophen] Nausea And Vomiting    Social History: Sleep:  Sleeping ok Future Plans: continuing to work, possibly school    Physical Exam:  Filed Vitals:   06/13/15 0822  BP: 108/64  Height: 5' 4.76" (1.645 m)  Weight: 128 lb 6.4 oz (58.242 kg)   BP 108/64 mmHg  Ht 5' 4.76" (1.645 m)  Wt 128 lb 6.4 oz (58.242 kg)  BMI 21.52 kg/m2  LMP 05/29/2015 (Approximate) Body mass index: body mass index is 21.52 kg/(m^2). Blood pressure percentiles are 38% systolic and 45% diastolic based on 2000 NHANES data. Blood pressure percentile targets: 90: 125/79, 95: 128/83, 99 + 5 mmHg: 141/96.  Physical Exam  Constitutional: She is oriented to person, place, and time. She appears well-developed and well-nourished.  HENT:  Head: Normocephalic.  Neck: No thyromegaly present.  Cardiovascular: Normal rate, regular rhythm, normal heart sounds and intact distal pulses.   Pulmonary/Chest: Effort normal and breath sounds normal.  Abdominal: Soft. Bowel sounds are normal. There is tenderness.  LLQ to palpation  Musculoskeletal: Normal range of motion.  Neurological: She is alert and oriented to person, place, and time.  Skin: Skin is warm and dry.  Nexplanon palpated LUE. Well healed  Psychiatric: She has a normal mood and affect.  Assessment/Plan: 1. Surveillance of implantable subdermal contraceptive Having some bleeding but not severe. Sounds typical for early course of nexplanon. Site well healed. Despite improvement in pelvic pain, she would still like it removed. Discussed poor adherance to  condoms in the past. Acknowledged desire for children in the future, but is not prepared for them now. Is afraid of infertility in the future. Advised importance of keeping nexplanon for now until after surgery as this may be one of her best treatment options if she is diagnosed with endometriosis. Discussed ways to help bleeding prior to definitive step of removal after surgery.   2. Negative pregnancy test Received emergency contraception at last visit. Neg urine preg today.  - POCT urine pregnancy  3. Acne vulgaris Will try tretinoin. She will contact via my chart if she figures out exactly what this was that she used in the past.  - tretinoin (RETIN-A) 0.025 % gel; Apply topically at bedtime.  Dispense: 45 g; Refill: 3   Follow-up:  4 weeks   Medical decision-making:  > 25 minutes spent, more than 50% of appointment was spent discussing diagnosis and management of symptoms

## 2015-06-15 ENCOUNTER — Encounter (HOSPITAL_COMMUNITY): Payer: Self-pay

## 2015-06-15 ENCOUNTER — Emergency Department (INDEPENDENT_AMBULATORY_CARE_PROVIDER_SITE_OTHER)
Admission: EM | Admit: 2015-06-15 | Discharge: 2015-06-15 | Disposition: A | Discharge status: Home / Self Care | Payer: No Typology Code available for payment source | Source: Home / Self Care | Attending: Family Medicine | Admitting: Family Medicine

## 2015-06-15 DIAGNOSIS — E860 Dehydration: Secondary | ICD-10-CM | POA: Diagnosis not present

## 2015-06-15 DIAGNOSIS — R000 Tachycardia, unspecified: Secondary | ICD-10-CM | POA: Diagnosis not present

## 2015-06-15 LAB — POCT I-STAT, CHEM 8
BUN: 7 mg/dL (ref 6–20)
CALCIUM ION: 1.2 mmol/L (ref 1.12–1.23)
Chloride: 104 mmol/L (ref 101–111)
Creatinine, Ser: 0.8 mg/dL (ref 0.44–1.00)
GLUCOSE: 72 mg/dL (ref 65–99)
HCT: 45 % (ref 36.0–46.0)
HEMOGLOBIN: 15.3 g/dL — AB (ref 12.0–15.0)
Potassium: 3.6 mmol/L (ref 3.5–5.1)
Sodium: 140 mmol/L (ref 135–145)
TCO2: 22 mmol/L (ref 0–100)

## 2015-06-15 NOTE — ED Notes (Signed)
Went to plasma center earlier today, and was declined due to rapid pulse. Denies excessive heat exposure or exertion prior to vs at plasma center (had vs repeated after 15 min , and pulse went up) States she has not rested well past few days, and has been under a lot of stress. States she had a negative pregnancy test 2-3 days ago at her [facility] office, has an implant in for [facility], and is currently on her menses. NAD at present, pulse in normal range

## 2015-06-15 NOTE — Discharge Instructions (Signed)
Your hemoglobin was actually just a little elevated. I think that you are dehydrated.  Your heart-rate is normal, so I don't think this is anything to do with your heart.   Make sure to drink 6-8 cups of water a day.   Come back to see [place] if this happens again, you start feeling palpitations (heart racing), your chest hurts, have trouble breathing, or you feel like you're going to pass out.

## 2015-06-15 NOTE — ED Provider Notes (Signed)
CSN: [id number]     Arrival date & time 06/15/15  1408 History   First MD Initiated Contact with Patient 06/15/15 1455     Chief Complaint  Patient presents with  . Tachycardia   (Consider location/radiation/quality/duration/timing/severity/associated sxs/prior Treatment) HPI 19 yo F who presented today to donate plasma. Was told her pulse there was initiall 118, 5 minutes later was 122.  Came to Urgent Care to be evaluated.  Had negative Upreg 2 days ago at pediatrician's.  Has Nexplanon inserted about 1 month ago for menorrhagia.  No bleeding x 1 month until about 1 week ago.  Bleeding recurred and she has felt lightheaded since then, worse when she stands.  Also somewhat nauseous. No abdominal pain.  No chest pain, palpitations, dyspnea.  No fevers or chills.    Past Medical History  Diagnosis Date  . 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 UTI (urinary tract infection) 04/23/2014    85K CFU's of Klebsiella, treated with Augmentin x 10 days    Past Surgical History  Procedure Laterality Date  . Dental surgery  03/23/14    wisdom teeth extraction   Family History  Problem Relation Age of Onset  . Dementia Father   . Depression Father   . Cancer Paternal Uncle   . Cancer Maternal Grandmother     leukemia  . Heart disease Paternal Grandmother     arrhythmia  . Diabetes Paternal Grandfather    History  Substance Use Topics  . Smoking status: Former Smoker    Types: Cigarettes  . Smokeless tobacco: Not on file     Comment: ttrying to stop  . Alcohol Use: Not on file   OB History    Gravida Para Term Preterm AB TAB SAB Ectopic Multiple Living   0 0 0 0 0 0 0 0 0 0      Review of Systems  Allergies  Hydrocodone and Vicodin  Home Medications   Prior to Admission medications   Medication Sig Start Date End Date Taking? Authorizing Provider  etonogestrel (NEXPLANON) 68 MG IMPL implant 1 each by Subdermal route  once. 04/29/15   Historical Provider, MD  tretinoin (RETIN-A) 0.025 % gel Apply topically at bedtime. 06/13/15   Verneda Skill, FNP   BP 118/68 mmHg  Pulse 107  Temp(Src) 98.2 F (36.8 C) (Oral)  Resp 14  SpO2 100%  LMP 05/29/2015 (Approximate) Physical Exam  BP 118/68 mmHg  Pulse 107  Temp(Src) 98.2 F (36.8 C) (Oral)  Resp 14  SpO2 100%  LMP 05/29/2015 (Approximate) Gen: Well NAD HEENT: EOMI, dry mucus membranes.   Lungs: CTABL Nl WOB Heart: RRR no MRG.  Manual pulse by my count was 95.   Abd: NABS, NT, ND Ext:  No edema.    ED Course  Procedures (including critical care time) Labs Review Labs Reviewed  POCT I-STAT, CHEM 8 - Abnormal; Notable for the following:    Hemoglobin 15.3 (*)    All other components within normal limits    Imaging Review No results found.   MDM   1. Tachycardia   2. Dehydration    - appears to be dehydrated clinically.  Also hemoconcentrated. - increase PO fluid intake - FU if no improvement with [place] or pediatrician.     Tobey Grim, MD 06/15/15 1539

## 2015-06-18 ENCOUNTER — Encounter (HOSPITAL_COMMUNITY): Payer: Self-pay

## 2015-06-18 ENCOUNTER — Encounter (HOSPITAL_COMMUNITY)
Admission: RE | Admit: 2015-06-18 | Discharge: 2015-06-18 | Disposition: A | Discharge status: Home / Self Care | Payer: No Typology Code available for payment source | Source: Ambulatory Visit | Attending: Family Medicine | Admitting: Family Medicine

## 2015-06-18 DIAGNOSIS — Z01818 Encounter for other preprocedural examination: Secondary | ICD-10-CM | POA: Diagnosis present

## 2015-06-18 HISTORY — DX: Cardiac arrhythmia, unspecified: I49.9

## 2015-06-18 LAB — ABO/RH: ABO/RH(D): O POS

## 2015-06-18 LAB — CBC
HEMATOCRIT: 42.1 % (ref 36.0–46.0)
Hemoglobin: 15 g/dL (ref 12.0–15.0)
MCH: 30.5 pg (ref 26.0–34.0)
MCHC: 35.6 g/dL (ref 30.0–36.0)
MCV: 85.6 fL (ref 78.0–100.0)
PLATELETS: 218 10*3/uL (ref 150–400)
RBC: 4.92 MIL/uL (ref 3.87–5.11)
RDW: 12.7 % (ref 11.5–15.5)
WBC: 7.4 10*3/uL (ref 4.0–10.5)

## 2015-06-18 LAB — TYPE AND SCREEN
ABO/RH(D): O POS
ANTIBODY SCREEN: NEGATIVE

## 2015-06-18 NOTE — Patient Instructions (Addendum)
Your procedure is scheduled on:06/28/15  Enter through the Main Entrance at :1130 am Pick up desk phone and dial [phone] and inform us of your arrival.  Please call [phone] if you have any problems the morning of surgery.  Remember: Do not eat food after midnight:Thursday Clear liquids are ok until:9am on Fri.   You may brush your teeth the morning of surgery.   DO NOT wear jewelry, eye make-up, lipstick,body lotion, or dark fingernail polish.  (Polished toes are ok) You may wear deodorant.  Patients discharged on the day of surgery will not be allowed to drive home. Wear loose fitting, comfortable clothes for your ride home.

## 2015-06-27 NOTE — Anesthesia Preprocedure Evaluation (Addendum)
Anesthesia Evaluation  Patient identified by MRN, date of birth, ID band Patient awake    Reviewed: Allergy & Precautions, H&P , NPO status , Patient's Chart, lab work & pertinent test results, reviewed documented beta blocker date and time   History of Anesthesia Complications Negative for: history of anesthetic complications  Airway Mallampati: II  TM Distance: >3 FB Neck ROM: full    Dental no notable dental hx. (+) Dental Advisory Given   Pulmonary Current Smoker,  breath sounds clear to auscultation  Pulmonary exam normal       Cardiovascular + dysrhythmias (palpitations) Rhythm:regular Rate:Normal     Neuro/Psych negative neurological ROS  negative psych ROS   GI/Hepatic negative GI ROS, Neg liver ROS,   Endo/Other  negative endocrine ROS  Renal/GU negative Renal ROS  negative genitourinary   Musculoskeletal negative musculoskeletal ROS (+)   Abdominal   Peds negative pediatric ROS (+)  Hematology negative hematology ROS (+)   Anesthesia Other Findings   Reproductive/Obstetrics negative OB ROS                            Anesthesia Physical Anesthesia Plan  ASA: II  Anesthesia Plan: General   Post-op Pain Management:    Induction: Intravenous  Airway Management Planned: Oral ETT  Additional Equipment:   Intra-op Plan:   Post-operative Plan: Extubation in OR  Informed Consent: I have reviewed the patients History and Physical, chart, labs and discussed the procedure including the risks, benefits and alternatives for the proposed anesthesia with the patient or authorized representative who has indicated his/her understanding and acceptance.   Dental Advisory Given and Dental advisory given  Plan Discussed with: CRNA and Surgeon  Anesthesia Plan Comments: (  Discussed general anesthesia, including possible nausea, instrumentation of airway, sore throat,pulmonary  aspiration, etc. I asked if the were any outstanding questions, or  concerns before we proceeded. )        Anesthesia Quick Evaluation

## 2015-06-28 ENCOUNTER — Encounter (HOSPITAL_COMMUNITY)
Admission: RE | Disposition: A | Discharge status: Home / Self Care | Payer: Self-pay | Source: Ambulatory Visit | Attending: Family Medicine

## 2015-06-28 ENCOUNTER — Ambulatory Visit (HOSPITAL_COMMUNITY): Payer: No Typology Code available for payment source | Admitting: Anesthesiology

## 2015-06-28 ENCOUNTER — Ambulatory Visit (HOSPITAL_COMMUNITY)
Admission: RE | Admit: 2015-06-28 | Discharge: 2015-06-28 | Disposition: A | Discharge status: Home / Self Care | Payer: No Typology Code available for payment source | Source: Ambulatory Visit | Attending: Family Medicine | Admitting: Family Medicine

## 2015-06-28 ENCOUNTER — Encounter (HOSPITAL_COMMUNITY): Payer: Self-pay

## 2015-06-28 DIAGNOSIS — G8929 Other chronic pain: Secondary | ICD-10-CM

## 2015-06-28 DIAGNOSIS — R102 Pelvic and perineal pain: Secondary | ICD-10-CM | POA: Insufficient documentation

## 2015-06-28 DIAGNOSIS — Z87440 Personal history of urinary (tract) infections: Secondary | ICD-10-CM | POA: Insufficient documentation

## 2015-06-28 DIAGNOSIS — N803 Endometriosis of pelvic peritoneum: Secondary | ICD-10-CM | POA: Diagnosis not present

## 2015-06-28 DIAGNOSIS — N736 Female pelvic peritoneal adhesions (postinfective): Secondary | ICD-10-CM | POA: Insufficient documentation

## 2015-06-28 DIAGNOSIS — N949 Unspecified condition associated with female genital organs and menstrual cycle: Secondary | ICD-10-CM

## 2015-06-28 DIAGNOSIS — F17210 Nicotine dependence, cigarettes, uncomplicated: Secondary | ICD-10-CM | POA: Diagnosis not present

## 2015-06-28 DIAGNOSIS — Q513 Bicornate uterus: Secondary | ICD-10-CM | POA: Insufficient documentation

## 2015-06-28 HISTORY — PX: LAPAROSCOPY: SHX197

## 2015-06-28 LAB — PREGNANCY, URINE: Preg Test, Ur: NEGATIVE

## 2015-06-28 SURGERY — LAPAROSCOPY, DIAGNOSTIC
Anesthesia: General | Site: Abdomen

## 2015-06-28 MED ORDER — ONDANSETRON HCL 4 MG/2ML IJ SOLN
INTRAMUSCULAR | Status: DC | PRN
  Administered 2015-06-28: 4 mg via INTRAVENOUS

## 2015-06-28 MED ORDER — KETOROLAC TROMETHAMINE 30 MG/ML IJ SOLN
INTRAMUSCULAR | Status: AC
  Filled 2015-06-28: qty 1

## 2015-06-28 MED ORDER — ROCURONIUM BROMIDE 100 MG/10ML IV SOLN
INTRAVENOUS | Status: DC | PRN
  Administered 2015-06-28: 30 mg via INTRAVENOUS

## 2015-06-28 MED ORDER — SCOPOLAMINE 1 MG/3DAYS TD PT72
1.0000 | MEDICATED_PATCH | Freq: Once | TRANSDERMAL | Status: DC
  Administered 2015-06-28: 1.5 mg via TRANSDERMAL

## 2015-06-28 MED ORDER — LACTATED RINGERS IV SOLN: INTRAVENOUS | Status: DC

## 2015-06-28 MED ORDER — BUPIVACAINE HCL (PF) 0.25 % IJ SOLN
INTRAMUSCULAR | Status: DC | PRN
  Administered 2015-06-28: 3 mL

## 2015-06-28 MED ORDER — MIDAZOLAM HCL 2 MG/2ML IJ SOLN
INTRAMUSCULAR | Status: AC
  Filled 2015-06-28: qty 2

## 2015-06-28 MED ORDER — FENTANYL CITRATE (PF) 100 MCG/2ML IJ SOLN
INTRAMUSCULAR | Status: AC
  Filled 2015-06-28: qty 2

## 2015-06-28 MED ORDER — OXYCODONE-ACETAMINOPHEN 5-325 MG PO TABS: 1.0000 | ORAL_TABLET | Freq: Four times a day (QID) | ORAL | Status: DC | PRN

## 2015-06-28 MED ORDER — MIDAZOLAM HCL 2 MG/2ML IJ SOLN
INTRAMUSCULAR | Status: DC | PRN
  Administered 2015-06-28: 2 mg via INTRAVENOUS

## 2015-06-28 MED ORDER — KETOROLAC TROMETHAMINE 30 MG/ML IJ SOLN
INTRAMUSCULAR | Status: DC | PRN
  Administered 2015-06-28: 30 mg via INTRAVENOUS

## 2015-06-28 MED ORDER — PROPOFOL 10 MG/ML IV BOLUS
INTRAVENOUS | Status: AC
  Filled 2015-06-28: qty 20

## 2015-06-28 MED ORDER — PROPOFOL 10 MG/ML IV BOLUS
INTRAVENOUS | Status: DC | PRN
  Administered 2015-06-28: 150 mg via INTRAVENOUS

## 2015-06-28 MED ORDER — FENTANYL CITRATE (PF) 250 MCG/5ML IJ SOLN
INTRAMUSCULAR | Status: AC
  Filled 2015-06-28: qty 5

## 2015-06-28 MED ORDER — ONDANSETRON HCL 4 MG/2ML IJ SOLN: 4.0000 mg | Freq: Once | INTRAMUSCULAR | Status: DC | PRN

## 2015-06-28 MED ORDER — DEXAMETHASONE SODIUM PHOSPHATE 10 MG/ML IJ SOLN
INTRAMUSCULAR | Status: DC | PRN
  Administered 2015-06-28: 4 mg via INTRAVENOUS

## 2015-06-28 MED ORDER — GLYCOPYRROLATE 0.2 MG/ML IJ SOLN
INTRAMUSCULAR | Status: DC | PRN
  Administered 2015-06-28: 0.4 mg via INTRAVENOUS

## 2015-06-28 MED ORDER — FENTANYL CITRATE (PF) 100 MCG/2ML IJ SOLN
INTRAMUSCULAR | Status: DC | PRN
  Administered 2015-06-28: 25 ug via INTRAVENOUS
  Administered 2015-06-28 (×4): 50 ug via INTRAVENOUS
  Administered 2015-06-28: 25 ug via INTRAVENOUS

## 2015-06-28 MED ORDER — ONDANSETRON HCL 4 MG/2ML IJ SOLN
INTRAMUSCULAR | Status: AC
  Filled 2015-06-28: qty 2

## 2015-06-28 MED ORDER — BUPIVACAINE HCL (PF) 0.25 % IJ SOLN
INTRAMUSCULAR | Status: AC
  Filled 2015-06-28: qty 30

## 2015-06-28 MED ORDER — LIDOCAINE HCL (CARDIAC) 20 MG/ML IV SOLN
INTRAVENOUS | Status: DC | PRN
  Administered 2015-06-28: 40 mg via INTRATRACHEAL
  Administered 2015-06-28: 60 mg via INTRAVENOUS

## 2015-06-28 MED ORDER — LIDOCAINE HCL (CARDIAC) 20 MG/ML IV SOLN
INTRAVENOUS | Status: AC
  Filled 2015-06-28: qty 5

## 2015-06-28 MED ORDER — ROCURONIUM BROMIDE 100 MG/10ML IV SOLN
INTRAVENOUS | Status: AC
  Filled 2015-06-28: qty 1

## 2015-06-28 MED ORDER — DEXAMETHASONE SODIUM PHOSPHATE 4 MG/ML IJ SOLN
INTRAMUSCULAR | Status: AC
  Filled 2015-06-28: qty 1

## 2015-06-28 MED ORDER — SCOPOLAMINE 1 MG/3DAYS TD PT72
MEDICATED_PATCH | TRANSDERMAL | Status: AC
  Filled 2015-06-28: qty 1

## 2015-06-28 MED ORDER — NEOSTIGMINE METHYLSULFATE 10 MG/10ML IV SOLN
INTRAVENOUS | Status: DC | PRN
  Administered 2015-06-28: 3 mg via INTRAVENOUS

## 2015-06-28 MED ORDER — LACTATED RINGERS IV SOLN
INTRAVENOUS | Status: DC
  Administered 2015-06-28: 125 mL/h via INTRAVENOUS
  Administered 2015-06-28: 14:00:00 via INTRAVENOUS

## 2015-06-28 MED ORDER — FENTANYL CITRATE (PF) 100 MCG/2ML IJ SOLN
25.0000 ug | INTRAMUSCULAR | Status: DC | PRN
  Administered 2015-06-28: 50 ug via INTRAVENOUS

## 2015-06-28 MED ORDER — GLYCOPYRROLATE 0.2 MG/ML IJ SOLN
INTRAMUSCULAR | Status: AC
  Filled 2015-06-28: qty 2

## 2015-06-28 SURGICAL SUPPLY — 25 items
CABLE HIGH FREQUENCY MONO STRZ (ELECTRODE) IMPLANT
CATH ROBINSON RED A/P 16FR (CATHETERS) ×3 IMPLANT
CHLORAPREP W/TINT 26ML (MISCELLANEOUS) ×3 IMPLANT
CLOTH BEACON ORANGE TIMEOUT ST (SAFETY) ×3 IMPLANT
DRSG COVADERM PLUS 2X2 (GAUZE/BANDAGES/DRESSINGS) ×6 IMPLANT
DRSG OPSITE POSTOP 3X4 (GAUZE/BANDAGES/DRESSINGS) ×3 IMPLANT
GLOVE BIOGEL PI IND STRL 7.0 (GLOVE) ×1 IMPLANT
GLOVE BIOGEL PI INDICATOR 7.0 (GLOVE) ×2
GLOVE ECLIPSE 7.0 STRL STRAW (GLOVE) ×6 IMPLANT
GOWN STRL REUS W/TWL LRG LVL3 (GOWN DISPOSABLE) ×9 IMPLANT
NS IRRIG 1000ML POUR BTL (IV SOLUTION) ×3 IMPLANT
PACK LAPAROSCOPY BASIN (CUSTOM PROCEDURE TRAY) ×3 IMPLANT
PAD POSITIONER PINK NONSTERILE (MISCELLANEOUS) ×3 IMPLANT
SET IRRIG TUBING LAPAROSCOPIC (IRRIGATION / IRRIGATOR) IMPLANT
SUT VIC AB 3-0 X1 27 (SUTURE) ×3 IMPLANT
SUT VICRYL 0 UR6 27IN ABS (SUTURE) ×6 IMPLANT
SUT VICRYL 4-0 PS2 18IN ABS (SUTURE) ×3 IMPLANT
TOWEL OR 17X24 6PK STRL BLUE (TOWEL DISPOSABLE) ×6 IMPLANT
TRAY FOLEY CATH SILVER 14FR (SET/KITS/TRAYS/PACK) ×3 IMPLANT
TROCAR BALLN 12MMX100 BLUNT (TROCAR) IMPLANT
TROCAR OPTI TIP 5M 100M (ENDOMECHANICALS) ×6 IMPLANT
TROCAR XCEL DIL TIP R 11M (ENDOMECHANICALS) IMPLANT
TROCAR XCEL NON-BLD 11X100MML (ENDOMECHANICALS) ×3 IMPLANT
WARMER LAPAROSCOPE (MISCELLANEOUS) ×3 IMPLANT
WATER STERILE IRR 1000ML POUR (IV SOLUTION) ×3 IMPLANT

## 2015-06-28 NOTE — Interval H&P Note (Signed)
History and Physical Interval Note:  06/28/2015 12:55 PM  Kristy Sanchez  has presented today for surgery, with the diagnosis of Chronic pelvic pain  The various methods of treatment have been discussed with the patient and family. After consideration of risks, benefits and other options for treatment, the patient has consented to  Procedure(s): LAPAROSCOPY DIAGNOSTIC (N/A) as a surgical intervention .  The patient's history has been reviewed, patient examined, no change in status, stable for surgery.  I have reviewed the patient's chart and labs.  Questions were answered to the patient's satisfaction.     PRATT,TANYA S

## 2015-06-28 NOTE — Transfer of Care (Signed)
Immediate Anesthesia Transfer of Care Note  Patient: Kristy Sanchez  Procedure(s) Performed: Procedure(s): LAPAROSCOPY DIAGNOSTIC WITH LYSIS OF ADHESIONS AND FULGERATION OF ENDOMETRIOSIS (N/A)  Patient Location: PACU  Anesthesia Type:General  Level of Consciousness: awake, alert , oriented and patient cooperative  Airway & Oxygen Therapy: Patient Spontanous Breathing and Patient connected to nasal cannula oxygen  Post-op Assessment: Report given to RN and Post -op Vital signs reviewed and stable  Post vital signs: Reviewed and stable  Last Vitals:  Filed Vitals:   06/28/15 1149  BP: 116/69  Pulse: 103  Temp: 37.2 C  Resp: 16    Complications: No apparent anesthesia complications

## 2015-06-28 NOTE — Anesthesia Procedure Notes (Signed)
Procedure Name: Intubation Date/Time: 06/28/2015 1:13 PM Performed by: Raenette Rover Pre-anesthesia Checklist: Patient identified, Patient being monitored, Emergency Drugs available and Suction available Patient Re-evaluated:Patient Re-evaluated prior to inductionOxygen Delivery Method: Circle system utilized Preoxygenation: Pre-oxygenation with 100% oxygen Intubation Type: IV induction Ventilation: Mask ventilation without difficulty Laryngoscope Size: Miller and 2 Grade View: Grade I Tube type: Oral Tube size: 7.0 mm Number of attempts: 1 Airway Equipment and Method: LTA kit utilized and Stylet Placement Confirmation: ETT inserted through vocal cords under direct vision,  breath sounds checked- equal and bilateral,  positive ETCO2 and CO2 detector Secured at: 20 cm Tube secured with: Tape Dental Injury: Teeth and Oropharynx as per pre-operative assessment

## 2015-06-28 NOTE — Anesthesia Postprocedure Evaluation (Signed)
  Anesthesia Post-op Note  Patient: Kristy Sanchez  Procedure(s) Performed: Procedure(s) (LRB): LAPAROSCOPY DIAGNOSTIC WITH LYSIS OF ADHESIONS AND FULGERATION OF ENDOMETRIOSIS (N/A)  Patient Location: PACU  Anesthesia Type: General  Level of Consciousness: awake and alert   Airway and Oxygen Therapy: Patient Spontanous Breathing  Post-op Pain: mild  Post-op Assessment: Post-op Vital signs reviewed, Patient's Cardiovascular Status Stable, Respiratory Function Stable, Patent Airway and No signs of Nausea or vomiting  Last Vitals:  Filed Vitals:   06/28/15 1400  BP: 114/36  Pulse: 118  Temp: 36.8 C  Resp: 16    Post-op Vital Signs: stable   Complications: No apparent anesthesia complications

## 2015-06-28 NOTE — Discharge Instructions (Signed)
Diagnostic Laparoscopy Laparoscopy is a surgical procedure. It is used to diagnose and treat diseases inside the belly (abdomen). It is usually a brief, common, and relatively simple procedure. The laparoscopeis a thin, lighted, pencil-sized instrument. It is like a telescope. It is inserted into your abdomen through a small cut (incision). Your caregiver can look at the organs inside your body through this instrument. He or she can see if there is anything abnormal. Laparoscopy can be done either in a hospital or outpatient clinic. You may be given a mild sedative to help you relax before the procedure. Once in the operating room, you will be given a drug to make you sleep (general anesthesia). Laparoscopy usually lasts less than 1 hour. After the procedure, you will be monitored in a recovery area until you are stable and doing well. Once you are home, it will take 2 to 3 days to fully recover. RISKS AND COMPLICATIONS  Laparoscopy has relatively few risks. Your caregiver will discuss the risks with you before the procedure. Some problems that can occur include:  Infection.  Bleeding.  Damage to other organs.  Anesthetic side effects. PROCEDURE Once you receive anesthesia, your surgeon inflates the abdomen with a harmless gas (carbon dioxide). This makes the organs easier to see. The laparoscope is inserted into the abdomen through a small incision. This allows your surgeon to see into the abdomen. Other small instruments are also inserted into the abdomen through other small openings. Many surgeons attach a video camera to the laparoscope to enlarge the view. During a diagnostic laparoscopy, the surgeon may be looking for inflammation, infection, or cancer. Your surgeon may take tissue samples(biopsies). The samples are sent to a specialist in looking at cells and tissue samples (pathologist). The pathologist examines them under a microscope. Biopsies can help to diagnose or confirm a  disease. AFTER THE PROCEDURE   The gas is released from inside the abdomen.  The incisions are closed with stitches (sutures). Because these incisions are small (usually less than 1/2 inch), there is usually minimal discomfort after the procedure. There may be some mild discomfort in the throat. This is from the tube placed in the throat while you were sleeping. You may have some mild abdominal discomfort. There may also be discomfort from the instrument placement incisions in the abdomen.  The recovery time is shortened as long as there are no complications.  You will rest in a recovery room until stable and doing well. As long as there are no complications, you may be allowed to go home. FINDING OUT THE RESULTS OF YOUR TEST Not all test results are available during your visit. If your test results are not back during the visit, make an appointment with your caregiver to find out the results. Do not assume everything is normal if you have not heard from your caregiver or the medical facility. It is important for you to follow up on all of your test results. HOME CARE INSTRUCTIONS   Take all medicines as directed.  Only take over-the-counter or prescription medicines for pain, discomfort, or fever as directed by your caregiver.  Resume daily activities as directed.  Showers are preferred over baths.  You may resume sexual activities in 1 week or as directed.  Do not drive while taking narcotics. SEEK MEDICAL CARE IF:   There is increasing abdominal pain.  There is new pain in the shoulders (shoulder strap areas).  You feel lightheaded or faint.  You have the chills.  You or   your child has an oral temperature above 102 F (38.9 C).  There is pus-like (purulent) drainage from any of the wounds.  You are unable to pass gas or have a bowel movement.  You feel sick to your stomach (nauseous) or throw up (vomit). MAKE SURE YOU:   Understand these instructions.  Will watch  your condition.  Will get help right away if you are not doing well or get worse. Document Released: 03/22/2001 Document Revised: 04/10/2013 Document Reviewed: 12/14/2007 ExitCare Patient Information 2015 [employer]. This information is not intended to replace advice given to you by your health care provider. Make sure you discuss any questions you have with your health care provider. Endometriosis Endometriosis is a condition in which the tissue that lines the uterus (endometrium) grows outside of its normal location. The tissue may grow in many locations close to the uterus, but it commonly grows on the ovaries, fallopian tubes, vagina, or bowel. Because the uterus expels, or sheds, its lining every menstrual cycle, there is bleeding wherever the endometrial tissue is located. This can cause pain because blood is irritating to tissues not normally exposed to it.  CAUSES  The cause of endometriosis is not known.  SIGNS AND SYMPTOMS  Often, there are no symptoms. When symptoms are present, they can vary with the location of the displaced tissue. Various symptoms can occur at different times. Although symptoms occur mainly during a woman's menstrual period, they can also occur midcycle and usually stop with menopause. Some people may go months with no symptoms at all. Symptoms may include:   Back or abdominal pain.   Heavier bleeding during periods.   Pain during intercourse.   Painful bowel movements.   Infertility. DIAGNOSIS  Your health care provider will do a physical exam and ask about your symptoms. Various tests may be done, such as:   Blood tests and urine tests. These are done to help rule out other problems.   Ultrasound. This test is done to look for abnormal tissue.   An X-ray of the lower bowel (barium enema).  Laparoscopy. In this procedure, a thin, lighted tube with a tiny camera on the end (laparoscope) is inserted into your abdomen. This helps your health  care provider look for abnormal tissue to confirm the diagnosis. The health care provider may also remove a small piece of tissue (biopsy) from any abnormal tissue found. This tissue sample can then be sent to a lab so it can be looked at under a microscope. TREATMENT  Treatment will vary and may include:   Medicines to relieve pain. Nonsteroidal anti-inflammatory drugs (NSAIDs) are a type of pain medicine that can help to relieve the pain caused by endometriosis.  Hormonal therapy. When using hormonal therapy, periods are eliminated. This eliminates the monthly exposure to blood by the displaced endometrial tissue.   Surgery. Surgery may sometimes be done to remove the abnormal endometrial tissue. In severe cases, surgery may be done to remove the fallopian tubes, uterus, and ovaries (hysterectomy). HOME CARE INSTRUCTIONS   Take all medicines as directed by your health care provider. Do not take aspirin because it may increase bleeding when you are not on hormonal therapy.   Avoid activities that produce pain, including sexual activity. SEEK MEDICAL CARE IF:  You have pelvic pain before, after, or during your periods.  You have pelvic pain between periods that gets worse during your period.  You have pelvic pain during or after sex.  You have pelvic pain with   bowel movements or urination, especially during your period.  You have problems getting pregnant.  You have a fever. SEEK IMMEDIATE MEDICAL CARE IF:   Your pain is severe and is not responding to pain medicine.   You have severe nausea and vomiting, or you cannot keep foods down.   You have pain that is limited to the right lower part of your abdomen.   You have swelling or increasing pain in your abdomen.   You see blood in your stool.  MAKE SURE YOU:   Understand these instructions.  Will watch your condition.  Will get help right away if you are not doing well or get worse. Document Released: 12/11/2000  Document Revised: 04/30/2014 Document Reviewed: 08/11/2013 ExitCare Patient Information 2015 [employer]. This information is not intended to replace advice given to you by your health care provider. Make sure you discuss any questions you have with your health care provider.  

## 2015-06-28 NOTE — Op Note (Signed)
Preoperative diagnosis: Pelvic pain   Postoperative diagnosis: Same, endometriosis  Procedure: Diagnostic laparoscopy, peritoneal biopsy, fulguration of endometriotic implants  Surgeon: Tinnie Gensanya Parneet Glantz, MD  Anesthesia: Charlesetta GaribaldiGETT - Mary Judd, MD  Findings: Bicornuate uterus, normal right tube, ovary. Left tube and ovary with adhesion to bowel. Evidence of endometriosis in posterior and anterior cul de sac and in the ovarian fossa. Normal liver edge, normal appendix  Estimated blood loss: Minimal  Complications: None known  Specimens: Peritoneal biopsies  Disposition of Specimen:  Pathology  Reason for procedure: 19 y.o. G0P0000 with h/o 1 year of intermittent pelvic pain which is improved with Nexplanon  Procedure: Patient was taken to the operating room was placed in dorsal lithotomy in Allen stirrups. She was prepped and draped in the usual sterile fashion. A timeout was performed. The patient had SCDs in place. Foley catheter is used to drain bladder. Speculum was placed inside the vagina. The cervix was visualized and grasped anteriorly with a single-tooth tenaculum. A Hulka tenaculum was placed through the cervix for uterine manipulation. The single tooth tenaculum and speculum were removed from the vagina.  Attention was then turned to the abdomen. Two cc of 0.25% Marcaine was injected at the umbilicus. Two Allis clamps were used to tent up the skin of the umbilicus a vertical one half centimeter incision was made here. The fascia was incised with the knife  And the peritoneum was entered sharply with this incision. Edges of the fascia were tagged with a 0 Vicryl suture on a UR 6 needle. An Excel 10 mm trocar was placed through this incision and a pneumoperitoneum was created. The patient was then placed in Trendelenburg. The findings were as noted above. Photos were taken. Peritoneal biopsy performed. Bipolar cautery used to fulgurate endometriotic implants in the cul-de sacs, but ovarian fossa  avoided due to proximity of ureter. The left tube and ovary were trapped in an adhesion with bowel and decision was made not to proceed with further adhesiolysis due to proximity of bowel. There were no additional findings in the pelvis and so the procedure was terminated. All instrument, needle  and lap counts were correct x 2. The patient was awakened to recovery in stable condition.  Candler Ginsberg SMD 06/28/2015 1:54 PM

## 2015-07-01 ENCOUNTER — Encounter (HOSPITAL_COMMUNITY): Payer: Self-pay | Admitting: Family Medicine

## 2015-07-15 ENCOUNTER — Encounter: Payer: Self-pay | Admitting: Pediatrics

## 2015-07-15 NOTE — Progress Notes (Signed)
Pre-Visit Planning  Kristy Sanchez  is a 19 y.o. female [facility].   Last seen in [facility] on 06/13/15 for Nexplanon f/u and acne management.  Diagnostic Laparoscopy Findings (06/28/15): Bicornuate uterus, normal right tube, ovary. Left tube and ovary with adhesion to bowel. Evidence of endometriosis in posterior and anterior cul de sac and in the ovarian fossa. Normal liver edge, normal appendix  Previous Psych Screenings?  n/a  Treatment plan at last visit included continue Nexplanon, try tretinoin 0.025% gel qhs for acne.   Clinical Staff Visit Tasks:   - Urine GC/CT due? No; HIV, RPR neg in past year - Psych Screenings Due? n/a  Provider Visit Tasks: - evaluate Nexplanon site, bleeding/cramping symptoms - encourage continued use of Nexplanon given endometriosis unless symptoms have worsened significantly from last visit - Pertinent Labs? Yes: Hgb, Hct were nl in June 2016

## 2015-07-16 ENCOUNTER — Ambulatory Visit (INDEPENDENT_AMBULATORY_CARE_PROVIDER_SITE_OTHER): Payer: Medicaid Other | Admitting: Pediatrics

## 2015-07-16 ENCOUNTER — Ambulatory Visit (INDEPENDENT_AMBULATORY_CARE_PROVIDER_SITE_OTHER): Payer: Medicaid Other | Admitting: Obstetrics & Gynecology

## 2015-07-16 ENCOUNTER — Encounter: Payer: Self-pay | Admitting: Pediatrics

## 2015-07-16 ENCOUNTER — Encounter: Payer: Self-pay | Admitting: Obstetrics & Gynecology

## 2015-07-16 VITALS — BP 98/67 | HR 92 | Ht 65.5 in | Wt 123.0 lb

## 2015-07-16 VITALS — BP 104/68 | HR 96 | Resp 16 | Ht 65.0 in | Wt 123.0 lb

## 2015-07-16 DIAGNOSIS — Z3169 Encounter for other general counseling and advice on procreation: Secondary | ICD-10-CM

## 2015-07-16 DIAGNOSIS — Z720 Tobacco use: Secondary | ICD-10-CM

## 2015-07-16 DIAGNOSIS — N809 Endometriosis, unspecified: Secondary | ICD-10-CM

## 2015-07-16 DIAGNOSIS — Z3009 Encounter for other general counseling and advice on contraception: Secondary | ICD-10-CM | POA: Diagnosis not present

## 2015-07-16 DIAGNOSIS — Z3049 Encounter for surveillance of other contraceptives: Secondary | ICD-10-CM | POA: Diagnosis not present

## 2015-07-16 DIAGNOSIS — Z319 Encounter for procreative management, unspecified: Secondary | ICD-10-CM

## 2015-07-16 DIAGNOSIS — J069 Acute upper respiratory infection, unspecified: Secondary | ICD-10-CM

## 2015-07-16 DIAGNOSIS — N926 Irregular menstruation, unspecified: Secondary | ICD-10-CM

## 2015-07-16 DIAGNOSIS — Z3046 Encounter for surveillance of implantable subdermal contraceptive: Secondary | ICD-10-CM

## 2015-07-16 NOTE — Progress Notes (Signed)
   Subjective:    Patient ID: Kristy Sanchez, female    DOB: 09/03/1996, 19 y.o.   MRN: [id number]  HPI  This 19 yo engaged W G0 is here because she would like her Nexplanon removed so that she can conceive. She had an uncomplicated laparoscopy with removal and fulgaration of endometriosis. She is aware that she does not have any infertility issues at this point but she does want a pregnancy. (She had the Nexplanon for 2 months and it was helping her pelvic pain.   She has had no problems since surgery.   Review of Systems She is on MVI daily.    Objective:   Physical Exam [id number] Breathing, ambulating, and conversing normally Incisions- healed well  Consent was signed and time out was done. Her right arm was prepped with betadine after establishing the position of the Nexplanon. The area was infiltrated with 2 cc of 1% lidocaine. A small incision was made and the intact rod was easily removed. A steristrip was placed and her arm was noted to be hemostatic. It was bandaged.  She tolerated the procedure well.       Assessment & Plan:   Post op- doing well Desire for pregnancy- Nexplanon removed

## 2015-07-16 NOTE — Progress Notes (Signed)
THIS RECORD MAY CONTAIN CONFIDENTIAL INFORMATION THAT SHOULD NOT BE RELEASED WITHOUT REVIEW OF THE SERVICE PROVIDER.   Pre-Visit Planning  Kristy Sanchez is a 19 y.o. female [facility].  Last seen in [facility] on 06/13/15 for Nexplanon f/u and acne management.  Diagnostic Laparoscopy Findings (06/28/15): Bicornuate uterus, normal right tube, ovary. Left tube and ovary with adhesion to bowel. Evidence of endometriosis in posterior and anterior cul de sac and in the ovarian fossa. Normal liver edge, normal appendix  Previous Psych Screenings? n/a  Treatment plan at last visit included continue Nexplanon, try tretinoin 0.025% gel qhs for acne.   Clinical Staff Visit Tasks:  - Urine GC/CT due? No, negative on 05/07/15; HIV, RPR neg in past year - Psych Screenings Due? n/a  Provider Visit Tasks: - evaluate Nexplanon site, bleeding/cramping symptoms - encourage continued use of Nexplanon given endometriosis unless symptoms have worsened significantly from last visit - Pertinent Labs? Yes: Hgb, Hct were nl in June 2016  [facility]   Adolescent Medicine Consultation Follow-Up Visit Kristy Sanchez  is a 19 y.o. female referred by Voncille LoEttefagh, Kate, MD here today for follow-up of her nexplanon.    Previsit planning completed:  yes  Growth Chart Viewed? yes   History was provided by the patient.  PCP Confirmed?  yes   HPI:  Pt is a 19 y/o with a PMH of bicornuate uterus, endometriosis, irregular menstrual bleeding, and chronic abdominal pain here for f/u to discuss her nexplaon.   Endometriosis: improved abdominal pain since ex-lap on 7/1 with ablation of ectopic endometrial tissue. Did have mild abdominal pain this am that resolved with a BM. Not taking pain medication since surgery.   Nexplanon: removed today 2/2 desire for pregnancy related to fear of not being able to become pregnant given her h/x of irregular menses and  endometriosis.   Irregular menses:  -still having bleeding that is sometimes heavy and sometimes light. Uses a box of tampons per week. Tampons are sometimes saturated, but she generally changes them every time she uses the restroom.  -has been happening every day for 4 years with perhaps a 2 month break -no sx change with nexplanon   URI:  -boyfriend sick last week -complaining of sore throat, cough, rhinorrhea -no fever, appetite change  ROS otherwise negative for physical symptoms including h/a, n/v/d, SOB, chest pain, skin changes  Patient's last menstrual period was 07/16/2015. Allergies  Allergen Reactions  . Hydrocodone Nausea And Vomiting  . Vicodin [Hydrocodone-Acetaminophen] Nausea And Vomiting    Social History: School: graduated from [employer] for [employer] recently, plans to go to nursing school Nutrition/Eating Behaviors: eats all the time, + varied diet  Sports:  none  Exercise:  none Sleep:  sleep 8-10 hours at night, no naps during day, sometimes still feels tired in the morning  Confidentiality was discussed with the patient and if applicable, with caregiver as well.  Patient's personal or confidential phone number: not asked  Tobacco? quit smoking today Secondhand smoke exposure?  no Drugs/ETOH?  no  Partner preference?  female Sexually Active?  Yes, has 1 parner. They have known each other for 5 years. no condoms, monogamous    Pregnancy Prevention:  implant removed today, reviewed condoms Safe at home, in school & in relationships?  Yes no violence  Safe to self?  Yes , feeling down at times, tries to go outside and make self busy, doesn't want to go to couseling  Guns in the home?  no  Live in [place] with boyfriend, aunt and uncle live [place]   The following portions of the patient's history were reviewed and updated as appropriate: current medications, past medical history, past social history, past surgical history and problem list.  Physical Exam:  Filed  Vitals:   07/16/15 1339  BP: 98/67  Pulse: 92  Height: 5' 5.5" (1.664 m)  Weight: 123 lb (55.792 kg)   BP 98/67 mmHg  Pulse 92  Ht 5' 5.5" (1.664 m)  Wt 123 lb (55.792 kg)  BMI 20.15 kg/m2  LMP 07/16/2015 Body mass index: body mass index is 20.15 kg/(m^2). Blood pressure percentiles are 10% systolic and 55% diastolic based on 2000 NHANES data. Blood pressure percentile targets: 90: 125/80, 95: 129/84, 99 + 5 mmHg: 141/96.  Physical Exam  Constitutional: She appears well-developed and well-nourished.  HENT:  Head: Normocephalic.  Eyes: EOM are normal. Right eye exhibits no discharge. Left eye exhibits no discharge. No scleral icterus.  Neck: Normal range of motion.  Cardiovascular: Normal rate, regular rhythm, normal heart sounds and intact distal pulses.  Exam reveals no gallop and no friction rub.   No murmur heard. Pulmonary/Chest: Effort normal and breath sounds normal.  Abdominal: Soft. Bowel sounds are normal.  No hepatosplenomegaly   Musculoskeletal: Normal range of motion.  Neurological: She is alert.  Skin: Skin is warm and dry.  Wrap in place on proximal left arm   Psychiatric: She has a normal mood and affect.     Assessment/Plan: Pt is an 19 y/o with endometriosis here for f/u regarding nexplanon. Pt had implant removed today by obgyn due to desire for pregnancy. She feels that she is prepared to have a child and has adequate employment, housing, and social support.  See problem based plan below.   1. Surveillance of implantable subdermal contraceptive -nexplanon removed today by Obgyn due to patient's request.  -removal was uncomplicated -pt would like to become pregnant   2. Desire for pregnancy -pt concerned about ability to conceive given hx of irregular menses, endometriosis, and bicornuate uterus  -taking multivitamin, quit smoking today, in a monogamous relationship with 1 partner   -counseled on optimizing health, eating well, avoiding OTC medications,  taking vitamins, and making an appointment ASAP with PCP  3. Irregular menstrual bleeding -hx of chronic irregular menses -Hgb 15, 4 weeks ago  -plan to reevaluate menstruation in 3 months after implant discontinued   4. Endometriosis determined by laparoscopy -dx via laparoscopy on 7/1 with ablation of as much of the ectopic endometrial tissue as possible -patient's sx have improved since treatment   5. Smoking trying to quit - pt has nicotine patch from previous attempt to quit smoking - provided with information sheet on quitting and to RTC if decides to use nicotine clinic for taper   6. Upper respiratory infection - pt overall very well appearing  - slightly painful cervical lymphadenopathy but no fever, exudative pharyngitis, splenomegaly  - counseled on supportive care and to RTC if develops a high fever and if symptoms continue of > 2 weeks    Follow-up:  Return in about 3 months (around 10/16/2015).   Medical decision-making:  > 30 minutes spent, more than 50% of appointment was spent discussing diagnosis and management of symptoms

## 2015-07-16 NOTE — Patient Instructions (Signed)
Smoking Cessation, Tips for Success  If you are ready to quit smoking, congratulations! You have chosen to help yourself be healthier. Cigarettes bring nicotine, tar, carbon monoxide, and other irritants into your body. Your lungs, heart, and blood vessels will be able to work better without these poisons. There are many different ways to quit smoking. Nicotine gum, nicotine patches, a nicotine inhaler, or nicotine nasal spray can help with physical craving. Hypnosis, support groups, and medicines help break the habit of smoking.  WHAT THINGS CAN I DO TO MAKE QUITTING EASIER?   Here are some tips to help you quit for good:  · Pick a date when you will quit smoking completely. Tell all of your friends and family about your plan to quit on that date.  · Do not try to slowly cut down on the number of cigarettes you are smoking. Pick a quit date and quit smoking completely starting on that day.  · Throw away all cigarettes.    · Clean and remove all ashtrays from your home, work, and car.  · On a card, write down your reasons for quitting. Carry the card with you and read it when you get the urge to smoke.  · Cleanse your body of nicotine. Drink enough water and fluids to keep your urine clear or pale yellow. Do this after quitting to flush the nicotine from your body.  · Learn to predict your moods. Do not let a bad situation be your excuse to have a cigarette. Some situations in your life might tempt you into wanting a cigarette.  · Never have "just one" cigarette. It leads to wanting another and another. Remind yourself of your decision to quit.  · Change habits associated with smoking. If you smoked while driving or when feeling stressed, try other activities to replace smoking. Stand up when drinking your coffee. Brush your teeth after eating. Sit in a different chair when you read the paper. Avoid alcohol while trying to quit, and try to drink fewer caffeinated beverages. Alcohol and caffeine may urge you to  smoke.  · Avoid foods and drinks that can trigger a desire to smoke, such as sugary or spicy foods and alcohol.  · Ask people who smoke not to smoke around you.  · Have something planned to do right after eating or having a cup of coffee. For example, plan to take a walk or exercise.  · Try a relaxation exercise to calm you down and decrease your stress. Remember, you may be tense and nervous for the first 2 weeks after you quit, but this will pass.  · Find new activities to keep your hands busy. Play with a pen, coin, or rubber band. Doodle or draw things on paper.  · Brush your teeth right after eating. This will help cut down on the craving for the taste of tobacco after meals. You can also try mouthwash.    · Use oral substitutes in place of cigarettes. Try using lemon drops, carrots, cinnamon sticks, or chewing gum. Keep them handy so they are available when you have the urge to smoke.  · When you have the urge to smoke, try deep breathing.  · Designate your home as a nonsmoking area.  · If you are a heavy smoker, ask your health care provider about a prescription for nicotine chewing gum. It can ease your withdrawal from nicotine.  · Reward yourself. Set aside the cigarette money you save and buy yourself something nice.  · Look for   support from others. Join a support group or smoking cessation program. Ask someone at home or at work to help you with your plan to quit smoking.  · Always ask yourself, "Do I need this cigarette or is this just a reflex?" Tell yourself, "Today, I choose not to smoke," or "I do not want to smoke." You are reminding yourself of your decision to quit.  · Do not replace cigarette smoking with electronic cigarettes (commonly called e-cigarettes). The safety of e-cigarettes is unknown, and some may contain harmful chemicals.  · If you relapse, do not give up! Plan ahead and think about what you will do the next time you get the urge to smoke.  HOW WILL I FEEL WHEN I QUIT SMOKING?  You  may have symptoms of withdrawal because your body is used to nicotine (the addictive substance in cigarettes). You may crave cigarettes, be irritable, feel very hungry, cough often, get headaches, or have difficulty concentrating. The withdrawal symptoms are only temporary. They are strongest when you first quit but will go away within 10-14 days. When withdrawal symptoms occur, stay in control. Think about your reasons for quitting. Remind yourself that these are signs that your body is healing and getting used to being without cigarettes. Remember that withdrawal symptoms are easier to treat than the major diseases that smoking can cause.   Even after the withdrawal is over, expect periodic urges to smoke. However, these cravings are generally short lived and will go away whether you smoke or not. Do not smoke!  WHAT RESOURCES ARE AVAILABLE TO HELP ME QUIT SMOKING?  Your health care provider can direct you to community resources or hospitals for support, which may include:  · Group support.  · Education.  · Hypnosis.  · Therapy.  Document Released: 09/11/2004 Document Revised: 04/30/2014 Document Reviewed: 06/01/2013  ExitCare® Patient Information ©2015 [employer]. This information is not intended to replace advice given to you by your health care provider. Make sure you discuss any questions you have with your health care provider.

## 2015-08-17 ENCOUNTER — Emergency Department (HOSPITAL_COMMUNITY)
Admission: EM | Admit: 2015-08-17 | Discharge: 2015-08-17 | Discharge status: Against Medical Advice | Payer: Medicaid Other | Attending: Emergency Medicine | Admitting: Emergency Medicine

## 2015-08-17 ENCOUNTER — Encounter (HOSPITAL_COMMUNITY): Payer: Self-pay | Admitting: Cardiology

## 2015-08-17 DIAGNOSIS — T192XXA Foreign body in vulva and vagina, initial encounter: Secondary | ICD-10-CM | POA: Diagnosis present

## 2015-08-17 DIAGNOSIS — Y9389 Activity, other specified: Secondary | ICD-10-CM | POA: Insufficient documentation

## 2015-08-17 DIAGNOSIS — X58XXXA Exposure to other specified factors, initial encounter: Secondary | ICD-10-CM | POA: Diagnosis not present

## 2015-08-17 DIAGNOSIS — Y9289 Other specified places as the place of occurrence of the external cause: Secondary | ICD-10-CM | POA: Diagnosis not present

## 2015-08-17 DIAGNOSIS — Y998 Other external cause status: Secondary | ICD-10-CM | POA: Diagnosis not present

## 2015-08-17 NOTE — ED Notes (Signed)
Pt advised of wait time and states she is going to leave and come back later on.

## 2015-08-17 NOTE — ED Notes (Signed)
Thinks she may have a tampon stuck in her vagina.

## 2015-10-03 ENCOUNTER — Ambulatory Visit: Payer: Medicaid Other | Admitting: Pediatrics

## 2015-10-09 ENCOUNTER — Encounter: Payer: Self-pay | Admitting: Pediatrics

## 2015-10-10 ENCOUNTER — Encounter: Payer: Self-pay | Admitting: Pediatrics

## 2015-10-14 ENCOUNTER — Encounter: Payer: Self-pay | Admitting: Pediatrics

## 2015-10-14 NOTE — Progress Notes (Signed)
Pre-Visit Planning  Kristy Sanchez  is a 19 y.o. female [facility] Betti CruzKATE S, MD.   Last seen in [facility] on 07/16/2015 for f/u after nexplanon removal.  Discussion included preconception health, endometriosis and smoking cessation.   Previous Psych Screenings?  no  Treatment plan at last visit included reassurance and f/u in 3 months for monitoring of symptoms.   Clinical Staff Visit Tasks:   - Urine GC/CT due? no - Psych Screenings Due? no  Provider Visit Tasks: - Assess preconception concerns - Review smoking cessation - Facilitate referral to OBGYN - Pertinent Labs? no

## 2015-10-15 ENCOUNTER — Ambulatory Visit: Payer: Medicaid Other | Admitting: Pediatrics

## 2015-10-21 ENCOUNTER — Inpatient Hospital Stay (HOSPITAL_COMMUNITY)
Admission: EM | Admit: 2015-10-21 | Discharge: 2015-10-21 | Disposition: A | Discharge status: Home / Self Care | Payer: Medicaid Other | Source: Ambulatory Visit | Attending: Family Medicine | Admitting: Family Medicine

## 2015-10-21 DIAGNOSIS — Z3202 Encounter for pregnancy test, result negative: Secondary | ICD-10-CM

## 2015-10-21 DIAGNOSIS — Z3200 Encounter for pregnancy test, result unknown: Secondary | ICD-10-CM | POA: Diagnosis present

## 2015-10-21 LAB — POCT PREGNANCY, URINE
Preg Test, Ur: NEGATIVE
Preg Test, Ur: NEGATIVE

## 2015-10-21 NOTE — MAU Note (Signed)
Would like a blood pregnancy test. Took a dollar pregnancy test yesterday and it was negative.  Last period was a week ago.

## 2015-10-21 NOTE — MAU Provider Note (Signed)
S:  Kristy Sanchez is a 19 y.o. female here for pregnancy test.  Patient's Patient's last menstrual period was 10/07/2015.Marland Kitchen.  Denies any vaginal bleeding or abdominal pain. Had negative pregnancy test at home.  O:  Past Medical History  Diagnosis Date  . 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 UTI (urinary tract infection) 04/23/2014    85K CFU's of Klebsiella, treated with Augmentin x 10 days   . Dysrhythmia     recent tachycardia due to dehydration    Family History  Problem Relation Age of Onset  . Dementia Father   . Depression Father   . Cancer Paternal Uncle   . Cancer Maternal Grandmother     leukemia  . Heart disease Paternal Grandmother     arrhythmia  . Diabetes Paternal Lavell AnchorsGrandfather      Filed Vitals:   10/21/15 2038  BP: 115/71  Pulse: 102  Temp: 98.4 F (36.9 C)  Resp: 16   General:  A&OX3 with no signs of acute distress. She appears well-developed and well-nourished. No distress.  Neck: Normal range of motion.  Pulmonary/Chest: Effort normal. No respiratory distress.  Musculoskeletal: Normal range of motion.  Neurological: She is alert and oriented to person, place, and time.  Skin: Skin is warm and dry.   A: 1. Negative pregnancy test      P: Discharge home Wait until missed period, take home pregnancy test, start prenatal care if it's positive at that point.    Judeth HornErin Elianis Fischbach, NP

## 2015-10-23 ENCOUNTER — Encounter: Payer: Self-pay | Admitting: Pediatrics

## 2015-11-19 ENCOUNTER — Telehealth: Payer: Self-pay | Admitting: *Deleted

## 2015-11-19 NOTE — Telephone Encounter (Signed)
VM from pt, requesting callback and appt.   TC to pt. Past two days, pt has had white discharge with a fishy smell. Pt is concerned that discharge may be BV. Pt scheduled for appt 11/23.

## 2015-11-20 ENCOUNTER — Encounter: Payer: Self-pay | Admitting: *Deleted

## 2015-11-20 ENCOUNTER — Ambulatory Visit (INDEPENDENT_AMBULATORY_CARE_PROVIDER_SITE_OTHER): Payer: Medicaid Other | Admitting: Family

## 2015-11-20 ENCOUNTER — Encounter: Payer: Self-pay | Admitting: Family

## 2015-11-20 VITALS — BP 119/67 | HR 94 | Ht 65.0 in | Wt 119.4 lb

## 2015-11-20 DIAGNOSIS — N898 Other specified noninflammatory disorders of vagina: Secondary | ICD-10-CM | POA: Diagnosis not present

## 2015-11-20 DIAGNOSIS — F17210 Nicotine dependence, cigarettes, uncomplicated: Secondary | ICD-10-CM

## 2015-11-20 DIAGNOSIS — Z113 Encounter for screening for infections with a predominantly sexual mode of transmission: Secondary | ICD-10-CM | POA: Diagnosis not present

## 2015-11-20 DIAGNOSIS — Z3169 Encounter for other general counseling and advice on procreation: Secondary | ICD-10-CM | POA: Diagnosis not present

## 2015-11-20 MED ORDER — PRENATAL VITAMINS PLUS 27-1 MG PO TABS: 1.0000 | ORAL_TABLET | Freq: Every day | ORAL | Status: AC

## 2015-11-20 NOTE — Patient Instructions (Signed)
Will call with Lab results.

## 2015-11-20 NOTE — Progress Notes (Signed)
THIS RECORD MAY CONTAIN CONFIDENTIAL INFORMATION THAT SHOULD NOT BE RELEASED WITHOUT REVIEW OF THE SERVICE PROVIDER.  Adolescent Medicine Consultation Follow-Up Visit Kristy Sanchez  is a 19 y.o. female referred by Voncille LoEttefagh, Kate, MD here today for follow-up.    Previsit planning completed:  No, add-on:   VM from pt, requesting callback and appt.   TC to pt. Past two days, pt has had white discharge with a fishy smell. Pt is concerned that discharge may be BV. Pt scheduled for appt 11/23.   Growth Chart Viewed? not applicable   History was provided by the patient.  PCP Confirmed?  Yes, Voncille LoKate Ettefagh  My Chart Activated?   yes   HPI:   [age] yo female with PMH bicornuate uterus, hx of HSV-1 genital outbreak/chlamydia, smoker, presents with c/o of 2-day malodorous white vaginal discharge. States she noticed copious amounts of white discharge after sexual intercourse earlier this week. She denies painful intercourse, abdominal or pelvic pain, vaginal bleeding, vaginal lesions or prodrome.  -She is actively trying to conceive; is not taking prenatal vitamins.  -Admits that she has concerns over fertility d/t PMH, however she and partner are ready to conceive.  -Same partner off and on since 12yo; have been together for one year without a break, however she requests STI screening at this time.    Patient's last menstrual period was 10/07/2015. Allergies  Allergen Reactions  . Hydrocodone Nausea And Vomiting  . Vicodin [Hydrocodone-Acetaminophen] Nausea And Vomiting   Current Outpatient Prescriptions on File Prior to Visit  Medication Sig Dispense Refill  . tretinoin (RETIN-A) 0.025 % gel Apply topically at bedtime. 45 g 3   No current facility-administered medications on file prior to visit.     Confidentiality was discussed with the patient and if applicable, with caregiver as well.  Patient's personal or confidential phone number: on file Tobacco?  Yes -1/2 pack per day   Drugs/ETOH?  no Partner preference?  female Sexually Active?  yes   Pregnancy Prevention:  none, reviewed condoms & plan B Safe at home, in school & in relationships?  Yes Safe to self?  Yes   The following portions of the patient's history were reviewed and updated as appropriate: allergies, current medications, past family history, past medical history, past social history, past surgical history and problem list.  Physical Exam:  Filed Vitals:   11/20/15 1154  BP: 119/67  Pulse: 94  Height: 5\' 5"  (1.651 m)  Weight: 119 lb 6.4 oz (54.159 kg)   BP 119/67 mmHg  Pulse 94  Ht 5\' 5"  (1.651 m)  Wt 119 lb 6.4 oz (54.159 kg)  BMI 19.87 kg/m2  LMP 10/07/2015 Body mass index: body mass index is 19.87 kg/(m^2). Blood pressure percentiles are 79% systolic and 58% diastolic based on 2000 NHANES data. Blood pressure percentile targets: 90: 124/79, 95: 128/83, 99 + 5 mmHg: 140/95.  Physical Exam  Constitutional: She is oriented to person, place, and time. She appears well-developed. No distress.  HENT:  Head: Normocephalic and atraumatic.  Eyes: EOM are normal. Pupils are equal, round, and reactive to light. No scleral icterus.  Neck: Normal range of motion. Neck supple. No thyromegaly present.  Cardiovascular: Normal rate, regular rhythm, normal heart sounds and intact distal pulses.   No murmur heard. Pulmonary/Chest: Effort normal and breath sounds normal.  Abdominal: Soft. She exhibits no mass. There is no tenderness. There is no guarding.  Genitourinary: Uterus normal. Vaginal discharge found.  -copious white malodorous vaginal discharge from  os.  -vaginal walls WNL, no lesions -no CMT, cervix WNL  Musculoskeletal: Normal range of motion. She exhibits no edema.  Lymphadenopathy:    She has no cervical adenopathy.  Neurological: She is alert and oriented to person, place, and time. No cranial nerve deficit.  Skin: Skin is warm and dry. No rash noted.  Psychiatric: She has a normal  mood and affect. Her behavior is normal. Judgment and thought content normal.  Nursing note and vitals reviewed. Chaperone: Joella Prince, RN   Assessment/Plan: 1. Vaginal discharge -will send for results and treat once labs return   - WET PREP BY MOLECULAR PROBE - GC/Chlamydia Probe Amp  2. Routine screening for STI (sexually transmitted infection) Per patient request - RPR - HIV antibody  3. Encounter for preconception consultation -discussed importance of folic acid and preconception health -will send for referral to [facility] for further counseling d/t bicornuate uterus and desires for pg -agreeable to begin prenatals; also discussed smoking cessation - continue to decrease intake  - Prenatal Vit-Fe Fumarate-FA (PRENATAL VITAMINS PLUS) 27-1 MG TABS; Take 1 tablet by mouth daily.  Dispense: 30 tablet; Refill: 11  4. Smoking 1/2 pack a day or less  -as per above   Follow-up:  Return if symptoms worsen or fail to improve.   Medical decision-making:  > 25 minutes spent, more than 50% of appointment was spent discussing diagnosis and management of symptoms

## 2015-11-21 LAB — WET PREP BY MOLECULAR PROBE
Candida species: NEGATIVE
Gardnerella vaginalis: POSITIVE — AB
TRICHOMONAS VAG: NEGATIVE

## 2015-11-21 LAB — GC/CHLAMYDIA PROBE AMP
CT Probe RNA: POSITIVE — AB
GC Probe RNA: NEGATIVE

## 2015-11-21 LAB — HIV ANTIBODY (ROUTINE TESTING W REFLEX): HIV: NONREACTIVE

## 2015-11-21 LAB — RPR

## 2015-11-25 ENCOUNTER — Encounter: Payer: Self-pay | Admitting: Family

## 2015-11-25 ENCOUNTER — Telehealth: Payer: Self-pay | Admitting: *Deleted

## 2015-11-25 ENCOUNTER — Other Ambulatory Visit: Payer: Self-pay | Admitting: Family

## 2015-11-25 ENCOUNTER — Telehealth: Payer: Self-pay | Admitting: Family

## 2015-11-25 DIAGNOSIS — A749 Chlamydial infection, unspecified: Secondary | ICD-10-CM

## 2015-11-25 DIAGNOSIS — N760 Acute vaginitis: Secondary | ICD-10-CM

## 2015-11-25 DIAGNOSIS — B9689 Other specified bacterial agents as the cause of diseases classified elsewhere: Secondary | ICD-10-CM

## 2015-11-25 MED ORDER — METRONIDAZOLE 500 MG PO TABS: ORAL_TABLET | ORAL | Status: DC

## 2015-11-25 MED ORDER — AZITHROMYCIN 500 MG PO TABS: ORAL_TABLET | ORAL | Status: DC

## 2015-11-25 NOTE — Telephone Encounter (Signed)
TC to pt. Updated of labs. Pt would like medication, with partner treatment sent to her pharmacy. Pharmacy in Epic verified. Advised of abstaining from sex and ETOH. Pt verbalized understanding, and requested  Prescriptions have detailed instructions written.

## 2015-11-25 NOTE — Telephone Encounter (Signed)
-----   Message from Christianne Dolinhristy Millican, NP sent at 11/21/2015  6:00 PM EST ----- Results are positive for bacterial vaginosis and chlamydia.   Please advise that she will need 2 prescriptions for these infections. She should not consume alcohol while taking Flagyl for 5 days and for 72 hours after the last dose. The azithromycin is for chlamydia treatment and is a one time dose. She needs to avoid sexual activity for 7 days. Her partner also needs to be treated for chlamydia. Please advise if she would like to come in for treatment or have the prescriptions sent the pharmacy. Thanks, AGCO CorporationChristy

## 2015-11-25 NOTE — Telephone Encounter (Signed)
TC to patient. Patient notified that partner needs to be treated. Patient verbalized that partner had no insurance or current provider. Pt was advised that partner should seek medical treatment and that treatment was available through health department or that EPT could be picked up here, however EPT information sheet would need to also be given to patient's partner. She verbalized understanding, partner name/DOB not given; will pick up EPT and partner information sheet today.

## 2015-11-25 NOTE — Progress Notes (Signed)
Done. EPT sent to pharmacy, as pt stated that partner did not see a doctor recently. Pt verbalized understanding of treatment plan.

## 2015-11-25 NOTE — Telephone Encounter (Signed)
VM from pt requesting callback to discuss lab results she got today.   TC to pt. Verified that chlamydia is transmitted only through sexual contact. Answered questions regarding spread of chlamydia.  TC to pt. EPT sent to pharmacy. Reviewed STI labs with pt.

## 2015-11-25 NOTE — Progress Notes (Signed)
-  Notify patient that azithromycin and flagyl has been sent in.  -Also, since she is seeking pregnancy, she needs to stop taking Tretinoin gel because that is a Category C risk for pregnancy.  -She noted that her partner was seeing his doctor recently (the same day that she was here?), so he needs to follow up with his provider for treatment.

## 2016-02-04 ENCOUNTER — Encounter: Payer: Self-pay | Admitting: Pediatrics

## 2016-02-04 NOTE — Progress Notes (Signed)
Pre-Visit Planning  Kristy Sanchez  is a 20 y.o. female referred by [facility], MD.   Last seen in [facility] Clinic on 11/09/2015 for vaginal discharge, preconception counseling.   Previous Psych Screenings? no  Treatment plan at last visit included testing for etiology of vaginal discharge, discussion of healthy lifestyle changes indicated if patient is considering pregnancy attempt, discussed referral to OBGYN.  Pt tested positive for chlamydia and treatment was prescribed.  Pt also was counseled regarding EPT.  Clinical Staff Visit Tasks:   - Urine GC/CT due? yes - Psych Screenings Due? No - Prep for pelvic if having any GYN symptoms  Provider Visit Tasks: - Assess for STI symptoms - Discuss future OBGYN needs - [facility] Involvement? No - Pertinent Labs? yes Component     Latest Ref Rng 11/20/2015  Candida species     Negative NEG  Trichomonas vaginosis     Negative NEG  Gardnerella vaginalis     Negative POS (A)  CT Probe RNA      POSITIVE (A)  GC Probe RNA      NEGATIVE  RPR     NON REAC NON REAC  HIV     NONREACTIVE NONREACTIVE

## 2016-02-05 ENCOUNTER — Encounter: Payer: Self-pay | Admitting: *Deleted

## 2016-02-05 ENCOUNTER — Encounter: Payer: Self-pay | Admitting: Pediatrics

## 2016-02-05 ENCOUNTER — Telehealth: Payer: Self-pay | Admitting: *Deleted

## 2016-02-05 ENCOUNTER — Ambulatory Visit (INDEPENDENT_AMBULATORY_CARE_PROVIDER_SITE_OTHER): Payer: Medicaid Other | Admitting: Pediatrics

## 2016-02-05 VITALS — BP 117/68 | HR 90 | Ht 65.0 in | Wt 118.8 lb

## 2016-02-05 DIAGNOSIS — Z113 Encounter for screening for infections with a predominantly sexual mode of transmission: Secondary | ICD-10-CM | POA: Diagnosis not present

## 2016-02-05 DIAGNOSIS — Z3202 Encounter for pregnancy test, result negative: Secondary | ICD-10-CM

## 2016-02-05 LAB — POCT URINE PREGNANCY: Preg Test, Ur: NEGATIVE

## 2016-02-05 NOTE — Telephone Encounter (Signed)
Caller requesting proof of treatment be faxed to her office. Information faxed.

## 2016-02-05 NOTE — Progress Notes (Signed)
Pre-Visit Planning  Kristy Sanchez is a 20 y.o. female referred by [facility], MD.  Last seen in [facility] Medicine Clinic on 11/09/2015 for vaginal discharge, preconception counseling.   Previous Psych Screenings? no  Treatment plan at last visit included testing for etiology of vaginal discharge, discussion of healthy lifestyle changes indicated if patient is considering pregnancy attempt, discussed referral to OBGYN. Pt tested positive for chlamydia and treatment was prescribed. Pt also was counseled regarding EPT.  Clinical Staff Visit Tasks:  - Urine GC/CT due? yes - Psych Screenings Due? No - Prep for pelvic if having any GYN symptoms  Provider Visit Tasks: - Assess for STI symptoms - Discuss future OBGYN needs - [facility] Involvement? No - Pertinent Labs? yes Component  Latest Ref Rng 11/20/2015  Candida species  Negative NEG  Trichomonas vaginosis  Negative NEG  Gardnerella vaginalis  Negative POS (A)  CT Probe RNA   POSITIVE (A)  GC Probe RNA   NEGATIVE  RPR  NON REAC NON REAC  HIV  NONREACTIVE NONREACTIVE           THIS RECORD MAY CONTAIN CONFIDENTIAL INFORMATION THAT SHOULD NOT BE RELEASED WITHOUT REVIEW OF THE SERVICE PROVIDER.  Adolescent Medicine Consultation Follow-Up Visit Kristy Sanchez  is a 20 y.o. female referred by Voncille Lo, MD here today for follow-up.    Previsit planning completed:  yes  Growth Chart Viewed? yes   History was provided by the patient.  PCP Confirmed?  yes  My Chart Activated?   yes   HPI:     Vaginal Discharge  Diagnosed with BV and chlamydia at appointment 2 months ago. Took full treatment of flagyl and azithromycin. Has not had any further symptoms. No fevers or chills. Partner was also treated. No longer trying to become pregnant.  Weight Loss Patient is also concerned about weight loss over the past year. This has been unintentional per patient. Has lost  about 20 pounds over the past 2 years. No fevers or night sweats. No constipation. No hot or cold intolerance.   24 Hour recall: (Up at  6AM) B ( AM)-   Snk ( 9AM)-  Chips and gatorade L ( PM)-  Hamburger from [employer] Snk ( PM)-   D ( PM)-  3 slices of pizza, breadsticks, strawberry lemonade Snk ( PM)-   Typical day? Yes.    Back Pain Present for several years. Located in the middle of her pack. Intermittent. Does not radiate. Worse with sitting. Better with standing. Has not tried any medications.  Nausea  2 week history. Worse at night and in the morning. Also has chronic mild abdominal pain. Symptoms improved over the past 2 days. Pain not any worse after eating. No reflux symptoms. No constipation or diarrhea. Thinks that her abdominal pain may be related to ovulation.   Patient's last menstrual period was 01/20/2016. Allergies  Allergen Reactions  . Hydrocodone Nausea And Vomiting  . Vicodin [Hydrocodone-Acetaminophen] Nausea And Vomiting   Outpatient Encounter Prescriptions as of 02/05/2016  Medication Sig  . azithromycin (ZITHROMAX) 500 MG tablet Take 2 tablets (1000mg  total) by mouth once. (Patient not taking: Reported on 02/05/2016)  . metroNIDAZOLE (FLAGYL) 500 MG tablet Take one tablet by mouth twice daily for 5 days. (Patient not taking: Reported on 02/05/2016)  . Prenatal Vit-Fe Fumarate-FA (PRENATAL VITAMINS PLUS) 27-1 MG TABS Take 1 tablet by mouth daily. (Patient not taking: Reported on 02/05/2016)   No facility-administered encounter medications on file as of 02/05/2016.  Patient Active Problem List   Diagnosis Date Noted  . Desire for pregnancy 07/16/2015  . Chronic pelvic pain in female 03/20/2015  . Genital herpes simplex type 1 infection 02/01/2015  . Acne vulgaris 12/07/2014  . Bicornuate uterus 05/13/2014  . Tobacco dependence 05/13/2014  . 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 Irregular menstrual bleeding 04/19/2014    Social History   Social History  Narrative   ** Merged History Encounter **       Lives alone.  Graduated from high school in January 2015.  Working at [employer], plans to start to college in the future, major in psychology.       The following portions of the patient's history were reviewed and updated as appropriate: allergies, current medications, past family history, past medical history, past social history, past surgical history and problem list.  Physical Exam:  Filed Vitals:   02/05/16 1135  BP: 117/68  Pulse: 90  Height:  (1.651 m)  Weight: 118 lb 13.3 oz (53.9 kg)   BP 117/68 mmHg  Pulse 90  Ht  (1.651 m)  Wt 118 lb 13.3 oz (53.9 kg)  BMI 19.77 kg/m2  LMP 01/20/2016 Body mass index: body mass index is 19.77 kg/(m^2). Blood pressure percentiles are 74% systolic and 63% diastolic based on 2000 NHANES data. Blood pressure percentile targets: 90: 124/78, 95: 128/82, 99 + 5 mmHg: 140/95.  Physical Exam  Constitutional: She appears well-developed and well-nourished.  HENT:  Head: Normocephalic and atraumatic.  Eyes: EOM are normal. Pupils are equal, round, and reactive to light.  Neck: Normal range of motion. Neck supple.  Cardiovascular: Normal rate, regular rhythm and normal heart sounds.   Pulmonary/Chest: Effort normal and breath sounds normal. No respiratory distress.  Abdominal: Soft. Bowel sounds are normal. She exhibits no distension.  Musculoskeletal: Normal range of motion. She exhibits no edema.       Thoracic back: She exhibits normal range of motion, no tenderness and no bony tenderness.  Slight leftward curvature of thoracic spine.   Neurological: She is alert.  Skin: Skin is warm.  Psychiatric: She has a normal mood and affect. Her behavior is normal. Judgment and thought content normal.  Nursing note and vitals reviewed.    Assessment/Plan: 1) History of BV, Chlamydia infection  - Asymptomatic today - Urine pregnancy negative - Check urine GC/CT probe  2) Weight Loss.  Likely due to poor nutritional intake. No red flag signs or symptoms. Less likely to be due to metabolic disorder, though possible. Must also consider celiac disease given history of chronic abdominal pain.  - Discussed healthy eating habits including 3 balanced meals per day with 2 snacks - Consider further work up including celiac panel and TSH if weight does not improve or stabilize with dietary interventions  3) Back Pain. Likely MSK etiology related to slight scoliosis. No red flag signs or symptoms.  - Gave back strengthening exercises - Recommended tylenol and ibuprofen as needed.  4) Nausea / Abdominal Pain. Abdominal pain likely related to patient's endometriosis. Abdominal exam with mild periumbilical tenderness, but otherwise benign. May have possibly had a mild viral infection that caused her symptoms. No reflux symptoms. No post-prandial symptoms to suggest biliary etiology or PUD/gastritis.  - Given that her symptoms are improving, will proceed with watchful waiting for now. - Will follow up in 2 weeks - can discuss management options for endometriosis at that time as patient no longer attempting to become pregnant - Consider work up for  celiac disease if weight loss and abdominal pain persists.   Follow-up:  Return in about 2 weeks (around 02/19/2016).   Medical decision-making:  > 25 minutes spent, more than 50% of appointment was spent discussing diagnosis and management of symptoms

## 2016-02-05 NOTE — Patient Instructions (Addendum)

## 2016-02-06 LAB — GC/CHLAMYDIA PROBE AMP
CT Probe RNA: NOT DETECTED
GC Probe RNA: NOT DETECTED

## 2016-02-19 ENCOUNTER — Ambulatory Visit: Payer: Medicaid Other | Admitting: Pediatrics

## 2016-03-06 ENCOUNTER — Encounter: Payer: Self-pay | Admitting: Pediatrics

## 2016-03-10 ENCOUNTER — Encounter: Payer: Self-pay | Admitting: Pediatrics

## 2016-03-10 ENCOUNTER — Ambulatory Visit (INDEPENDENT_AMBULATORY_CARE_PROVIDER_SITE_OTHER): Payer: Medicaid Other | Admitting: *Deleted

## 2016-03-10 DIAGNOSIS — Z113 Encounter for screening for infections with a predominantly sexual mode of transmission: Secondary | ICD-10-CM | POA: Diagnosis not present

## 2016-03-11 LAB — WET PREP BY MOLECULAR PROBE
Candida species: NEGATIVE
Gardnerella vaginalis: POSITIVE — AB
TRICHOMONAS VAG: NEGATIVE

## 2016-03-11 LAB — GC/CHLAMYDIA PROBE AMP
CT Probe RNA: NOT DETECTED
GC Probe RNA: NOT DETECTED

## 2016-03-16 ENCOUNTER — Other Ambulatory Visit: Payer: Self-pay | Admitting: Pediatrics

## 2016-03-16 DIAGNOSIS — N760 Acute vaginitis: Principal | ICD-10-CM

## 2016-03-16 DIAGNOSIS — B9689 Other specified bacterial agents as the cause of diseases classified elsewhere: Secondary | ICD-10-CM

## 2016-03-16 MED ORDER — METRONIDAZOLE 500 MG PO TABS: 500.0000 mg | ORAL_TABLET | Freq: Two times a day (BID) | ORAL | Status: DC

## 2016-03-16 MED ORDER — METRONIDAZOLE 0.75 % VA GEL: VAGINAL | Status: DC

## 2016-03-17 ENCOUNTER — Encounter: Payer: Self-pay | Admitting: Pediatrics

## 2016-06-02 ENCOUNTER — Encounter: Payer: Self-pay | Admitting: Pediatrics

## 2016-06-05 ENCOUNTER — Ambulatory Visit (INDEPENDENT_AMBULATORY_CARE_PROVIDER_SITE_OTHER): Payer: Medicaid Other | Admitting: Pediatrics

## 2016-06-05 ENCOUNTER — Encounter: Payer: Self-pay | Admitting: Pediatrics

## 2016-06-05 VITALS — Temp 99.5°F | Wt 114.8 lb

## 2016-06-05 DIAGNOSIS — Z3202 Encounter for pregnancy test, result negative: Secondary | ICD-10-CM

## 2016-06-05 DIAGNOSIS — A499 Bacterial infection, unspecified: Secondary | ICD-10-CM

## 2016-06-05 DIAGNOSIS — N760 Acute vaginitis: Secondary | ICD-10-CM | POA: Diagnosis not present

## 2016-06-05 DIAGNOSIS — Z113 Encounter for screening for infections with a predominantly sexual mode of transmission: Secondary | ICD-10-CM | POA: Diagnosis not present

## 2016-06-05 DIAGNOSIS — B9689 Other specified bacterial agents as the cause of diseases classified elsewhere: Secondary | ICD-10-CM

## 2016-06-05 LAB — POCT RAPID HIV: Rapid HIV, POC: NEGATIVE

## 2016-06-05 LAB — POCT URINE PREGNANCY: Preg Test, Ur: NEGATIVE

## 2016-06-05 MED ORDER — METRONIDAZOLE 0.75 % VA GEL: 1.0000 | Freq: Every day | VAGINAL | Status: AC

## 2016-06-05 NOTE — Progress Notes (Signed)
  Subjective:    Kristy Sanchez is a 20 y.o. old female here for STD TESTING .    HPI Patient has been treated for recurrent BV in the past  - last treatment was in March 2017.  She was instructed to start prophylactic metronidazole gel twice weekly but reports that she stopped doing that a couple of months ago.  She has had recurrence of foul smelling vaginal discharge for the past few days.  She used a dose of the metronidazole vaginal gel yesterday and today her symptoms have resolved.      She is not trying to get pregnant currently.  She and her long-term boyfriend broke up and she has had 2 sexual partners in the past year .  She reports that her last intercourse was last week.  Her LMP was 06/27/16.    Review of Systems  History and Problem List: Kristy Sanchez has Slipped Rib Syndrome; Irregular menstrual bleeding; Bicornuate uterus; Tobacco dependence; Acne vulgaris; Genital herpes simplex type 1 infection; Chronic pelvic pain in female; and Desire for pregnancy on her problem list.  Kristy Sanchez  has a past medical history of Uterus bilocularis; UTI (urinary tract infection) (04/23/2014); and Dysrhythmia.  Immunizations needed: none     Objective:    Temp(Src) 99.5 F (37.5 C) (Temporal)  Wt 114 lb 12.8 oz (52.073 kg) Physical Exam  Constitutional: She is oriented to person, place, and time. She appears well-developed and well-nourished. No distress.  Genitourinary:  Exam declined by patient due to need to return to work immediately.  Neurological: She is alert and oriented to person, place, and time.  Psychiatric: She has a normal mood and affect.  Nursing note and vitals reviewed.      Assessment and Plan:   Kristy Sanchez is a 20 y.o. old female with  1. Routine screening for STI (sexually transmitted infection) - GC/Chlamydia Probe Amp - POCT Rapid HIV - RPR  2. Negative pregnancy test Discussed contraception with patient but she declines at this time.  Recommend consistent condom usage. -  POCT urine pregnancy  3. Bacterial vaginosis Patient prefers metronidazole gel over metronidazole tablets.  Rx metronidazole gel x 5 days.  Discussed option of vaginal probiotics to prevent recurrence.  Supportive cares, return precautions, and emergency procedures reviewed. - metroNIDAZOLE (METROGEL) 0.75 % vaginal gel; Place 1 Applicatorful vaginally at bedtime. For 5 days  Dispense: 70 g; Refill: 0  >50% of today's visit spent counseling and coordinating care for recurrent BV and contraception.  Time spent face-to-face with patient: 15 minutes.  Return if symptoms worsen or fail to improve.  Kinaya Hilliker, Betti CruzKATE S, MD

## 2016-06-06 LAB — GC/CHLAMYDIA PROBE AMP
CT PROBE, AMP APTIMA: NOT DETECTED
GC PROBE AMP APTIMA: NOT DETECTED

## 2016-06-06 LAB — RPR

## 2016-07-23 ENCOUNTER — Encounter: Payer: Self-pay | Admitting: Pediatrics

## 2016-09-12 LAB — LAB REPORT - SCANNED

## 2016-11-12 IMAGING — US US PELVIS COMPLETE
1 series · 14 of 25 positions shown · non-contrast
Comparison: 04/18/2014

CLINICAL DATA: Right ovarian enlargement



[Series 1: us pelvis complete · 0.22mm/px · 14 of 64 slices shown]
[im 1/64]
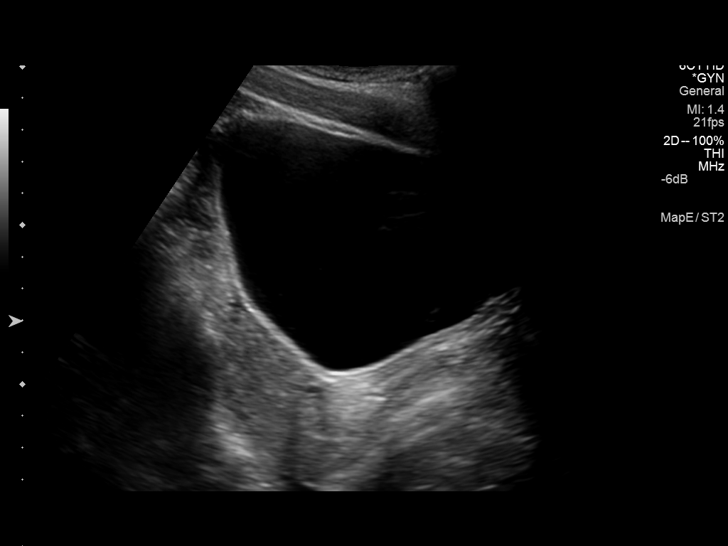
[im 6/64]
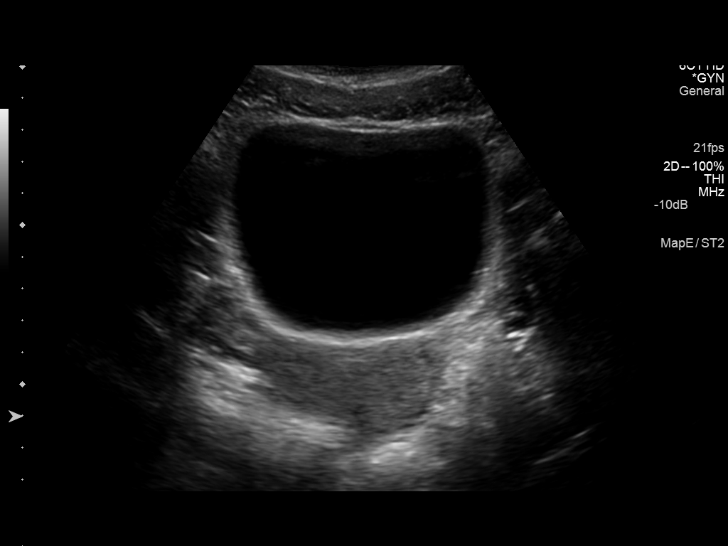
[im 11/64]
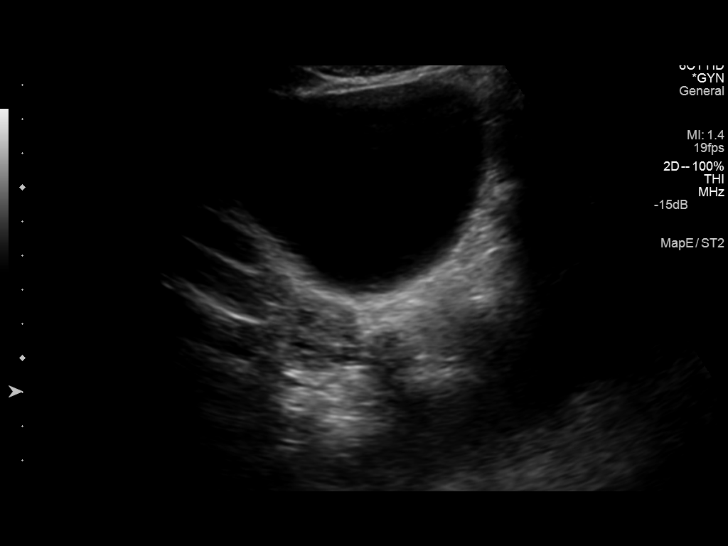
[im 16/64]
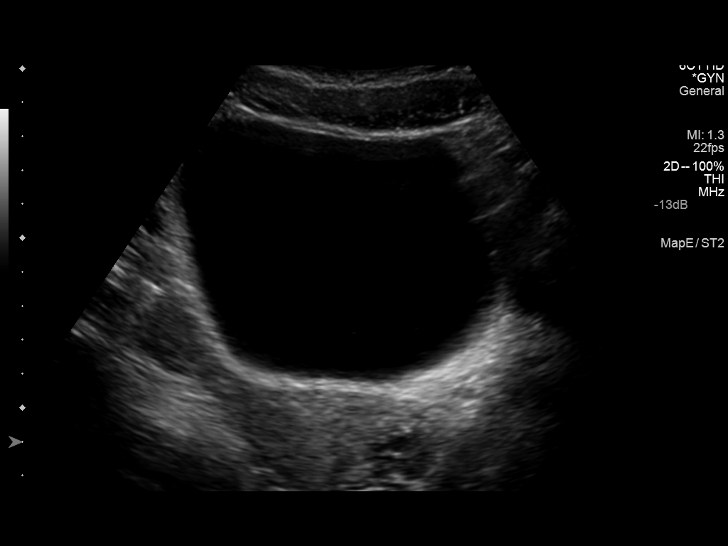
[im 22/64]
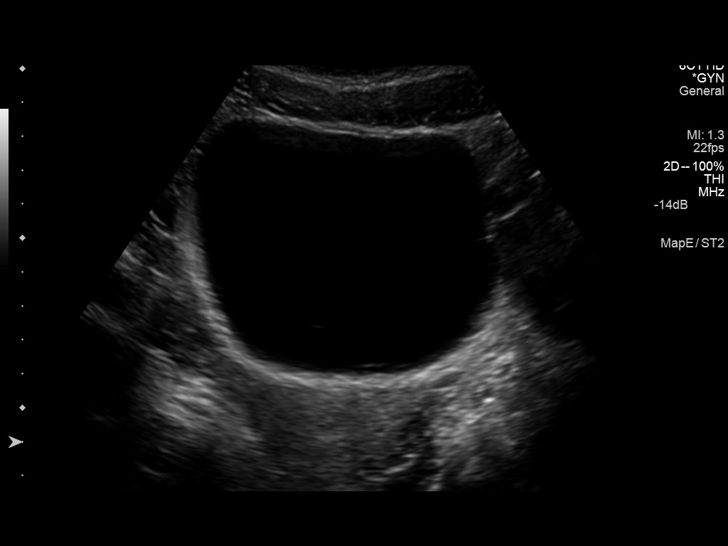
[im 24/64]
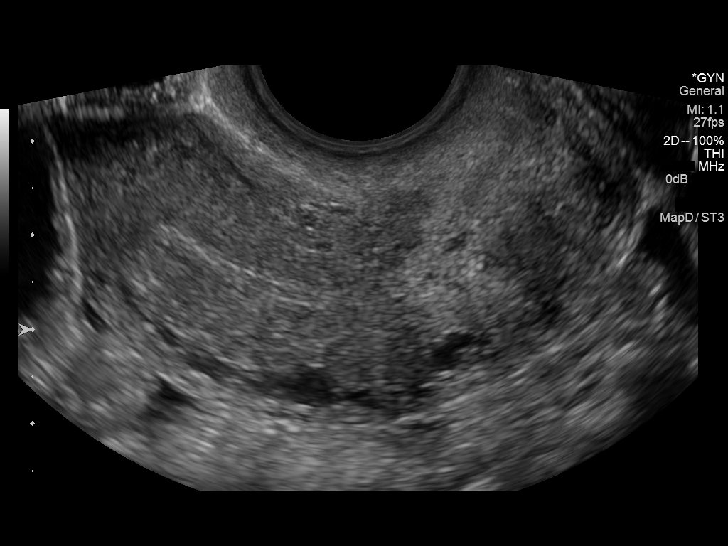
[im 29/64]
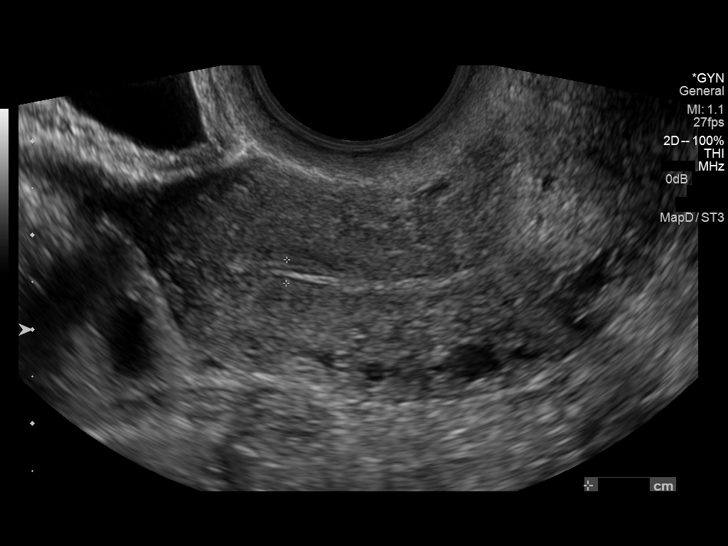
[im 35/64]
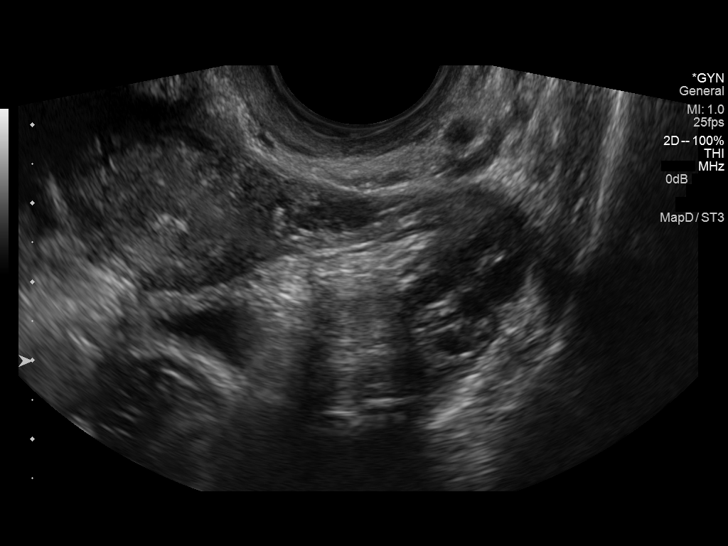
[im 40/64]
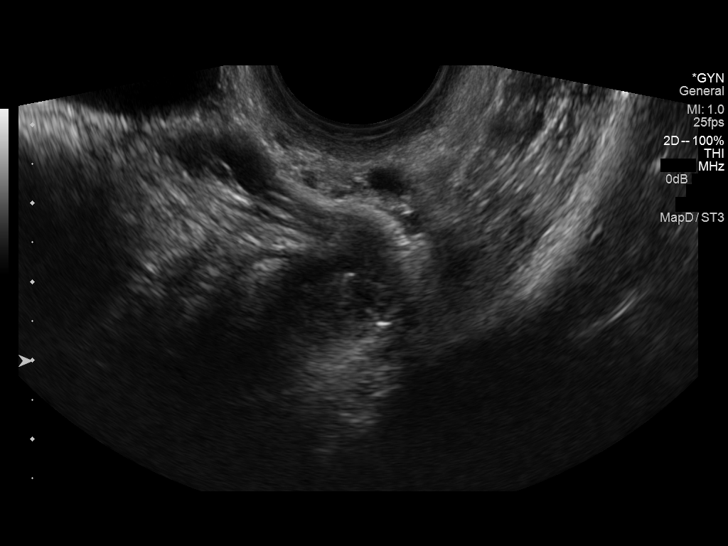
[im 43/64]
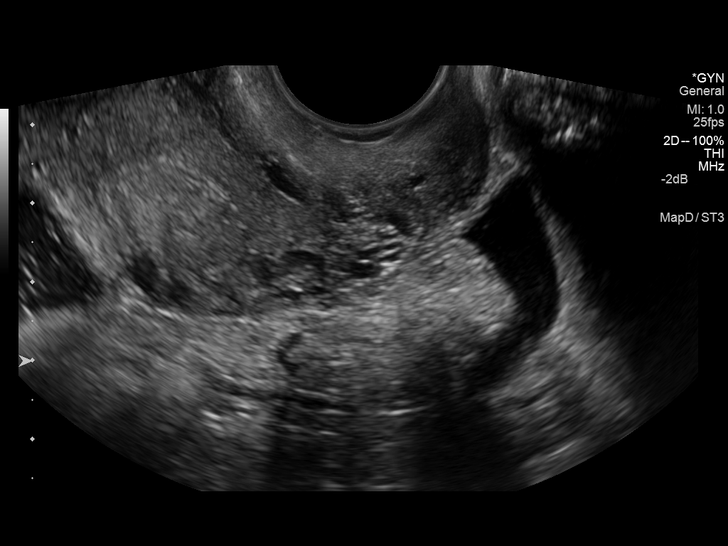
[im 48/64]
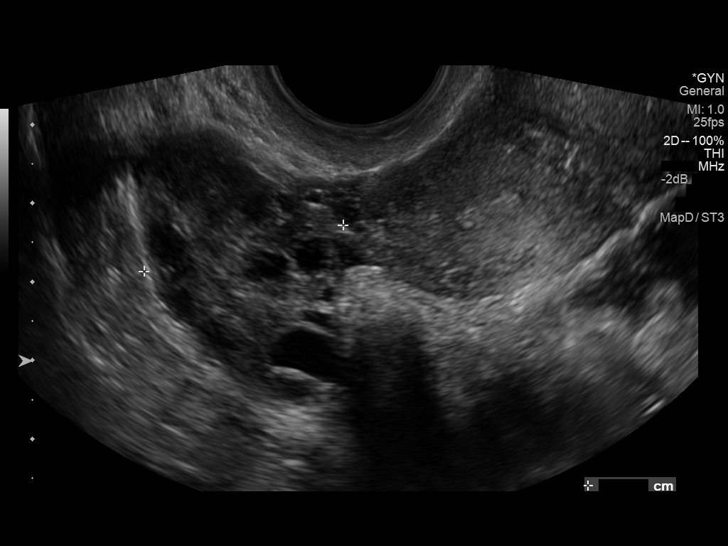
[im 53/64]
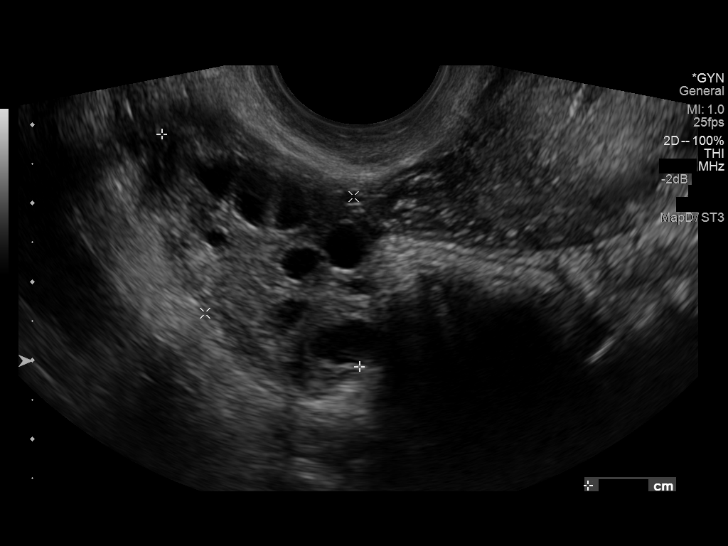
[im 58/64]
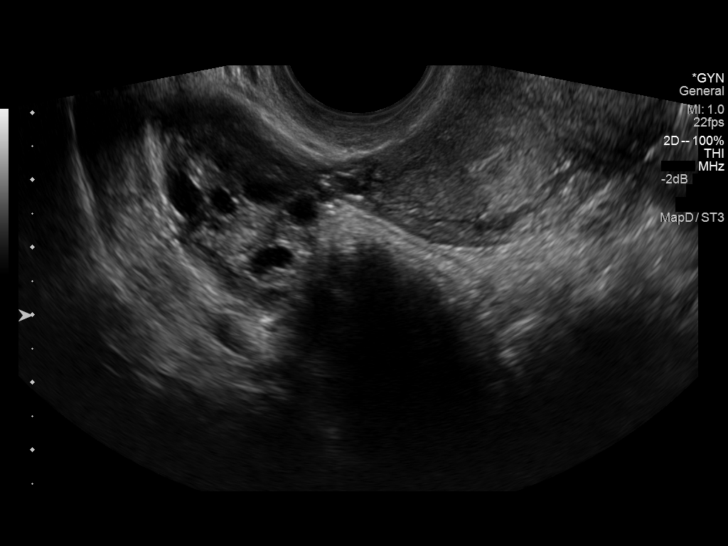
[im 64/64]
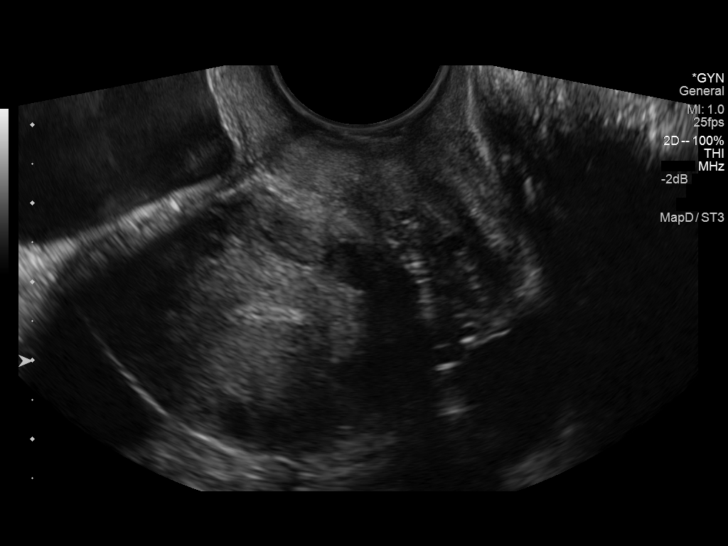

[14 of 25 positions shown; findings below may reference images not displayed]

FINDINGS: Uterus

Measurements: 5.5 x 2.6 x 4.8 cm. Divergent uterine horns. No
fibroids or other mass visualized.

Endometrium

Thickness: 3 mm.  No focal abnormality visualized.

Right ovary

Measurements: 3.9 x 2.4 x 2.6 cm. Small cysts/follicles.

Left ovary

Measurements: 3.0 x 1.5 x 2.1 cm. Small cysts/follicles.

Other findings

Small to moderate pelvic ascites, mildly complex.
IMPRESSION: Negative pelvic ultrasound.

## 2017-02-13 IMAGING — US US PELVIS COMPLETE
1 series · 13 of 25 positions shown · non-contrast
Comparison: 11/15/2014

CLINICAL DATA: Pelvic pain, nausea, possible ovarian torsion

EXAM:
TRANSABDOMINAL AND TRANSVAGINAL ULTRASOUND OF PELVIS
DOPPLER ULTRASOUND OF OVARIES
TECHNIQUE: Both transabdominal and transvaginal ultrasound examinations of the
pelvis were performed. Transabdominal technique was performed for
global imaging of the pelvis including uterus, ovaries, adnexal
regions, and pelvic cul-de-sac.
It was necessary to proceed with endovaginal exam following the
transabdominal exam to visualize the the ovaries. Color and duplex
Doppler ultrasound was utilized to evaluate blood flow to the
ovaries.

[Series 1: us pelvis complete · 0.20mm/px · 13 of 71 slices shown]
[im 1/71]
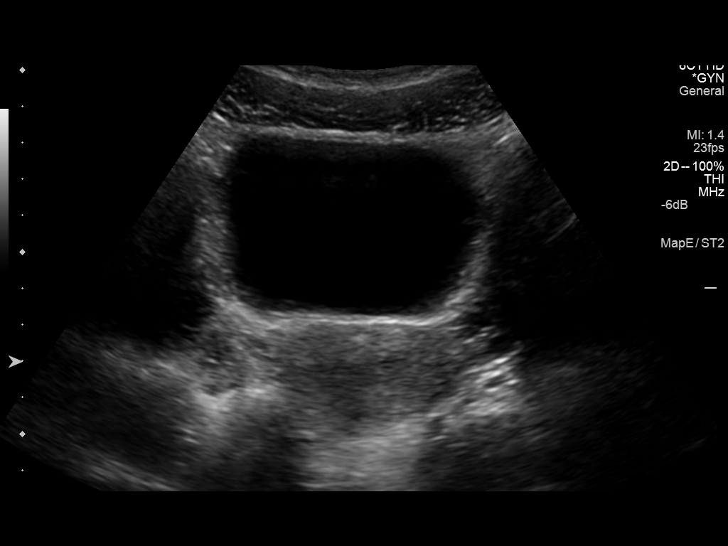
[im 6/71]
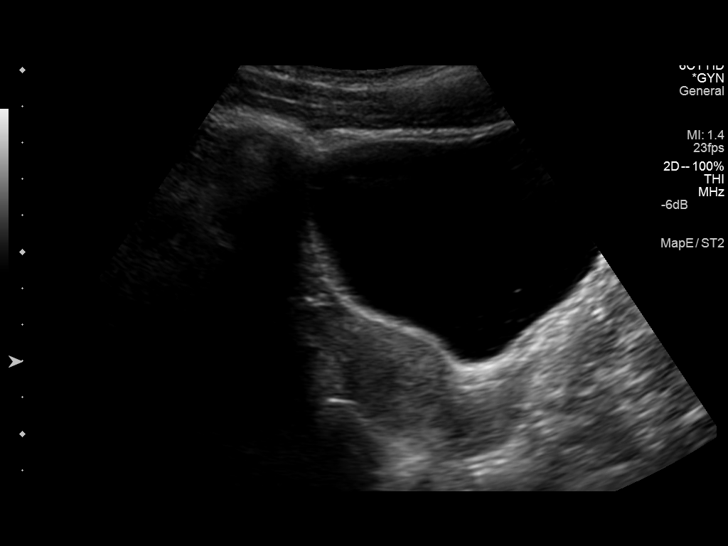
[im 12/71]
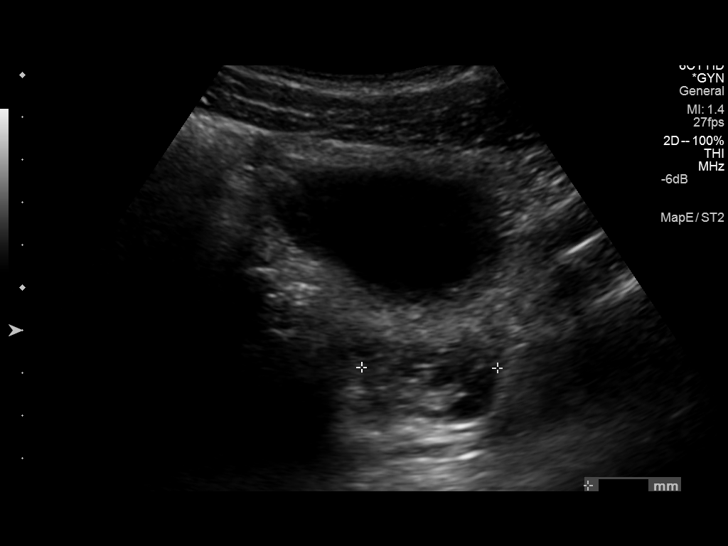
[im 18/71]
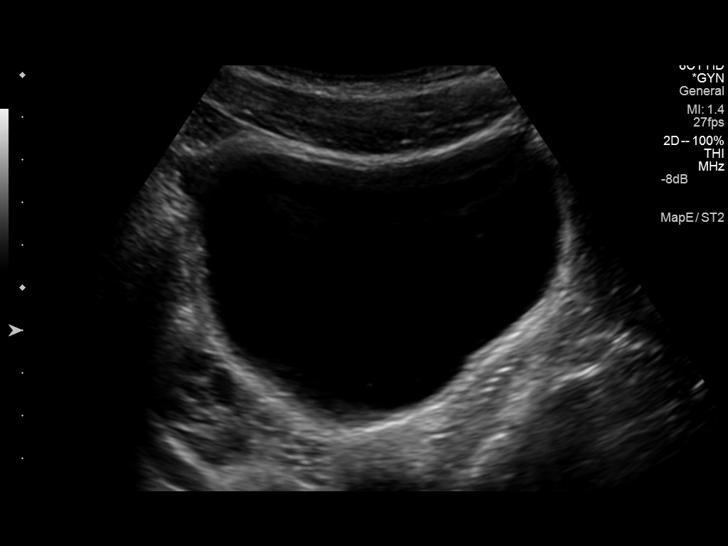
[im 24/71]
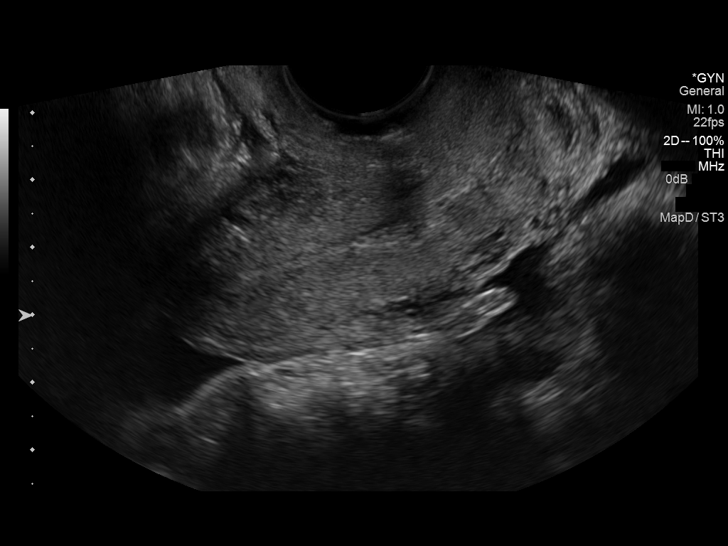
[im 30/71]
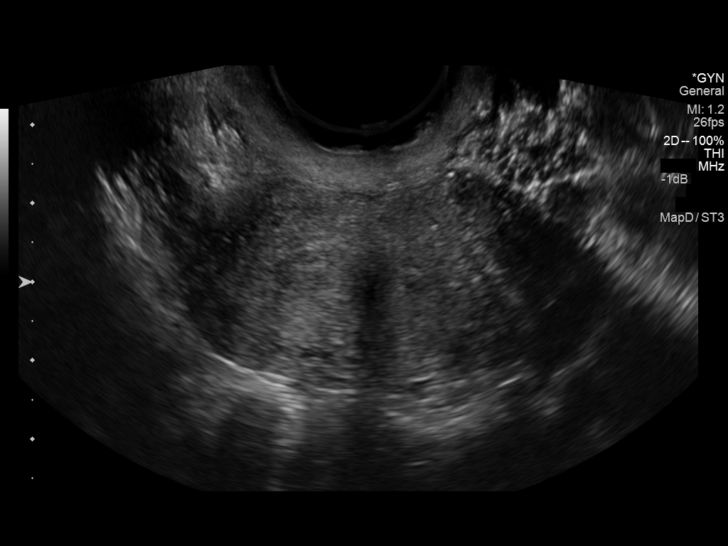
[im 36/71]
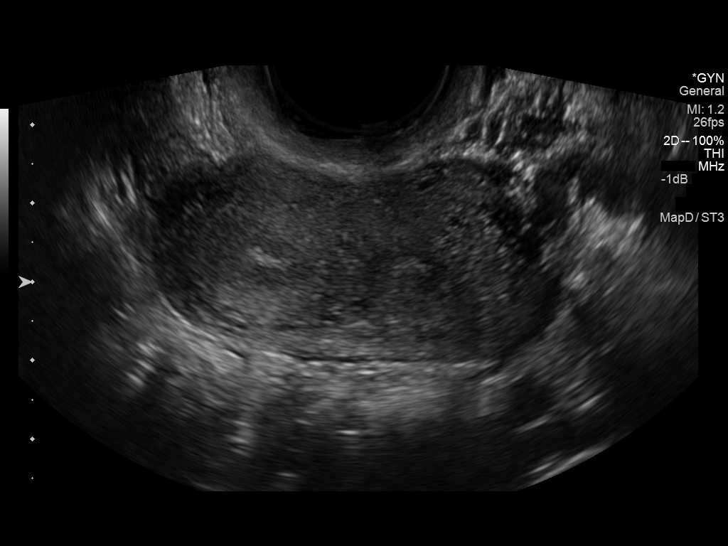
[im 41/71]
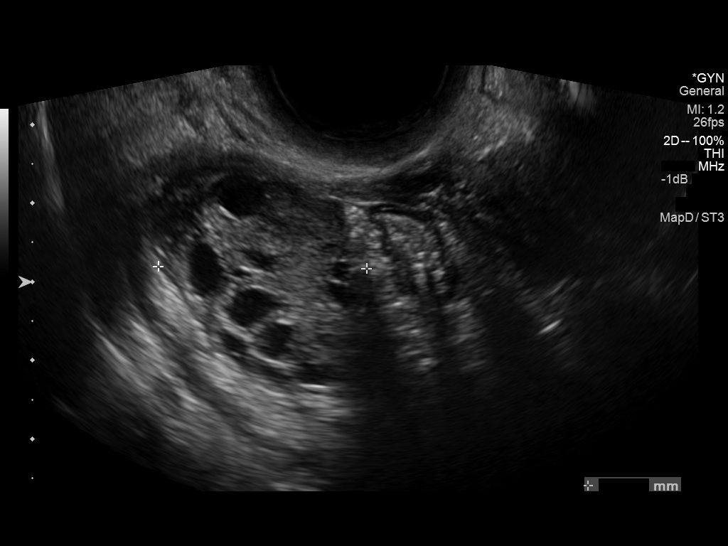
[im 47/71]
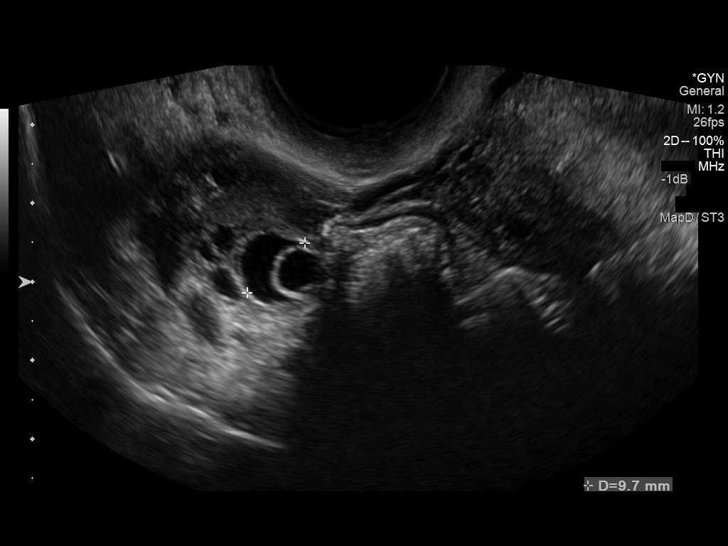
[im 53/71]
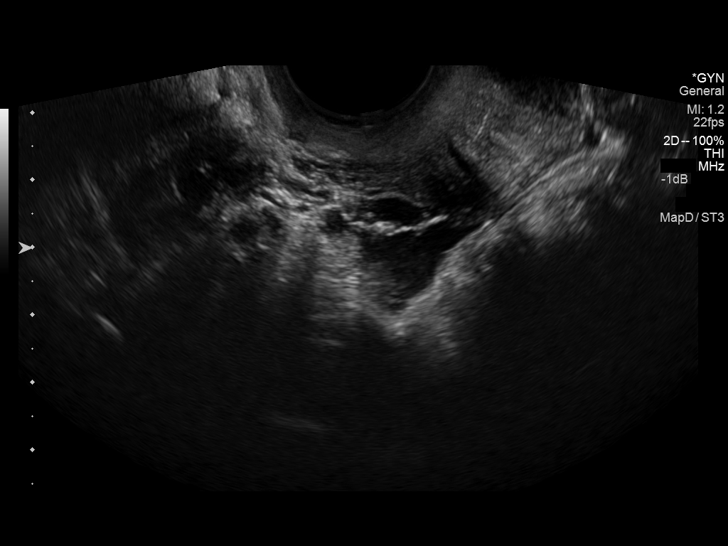
[im 59/71]
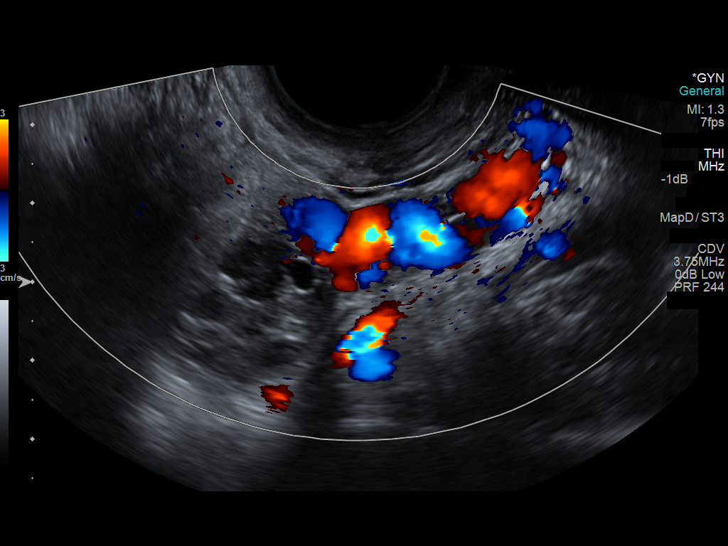
[im 65/71]
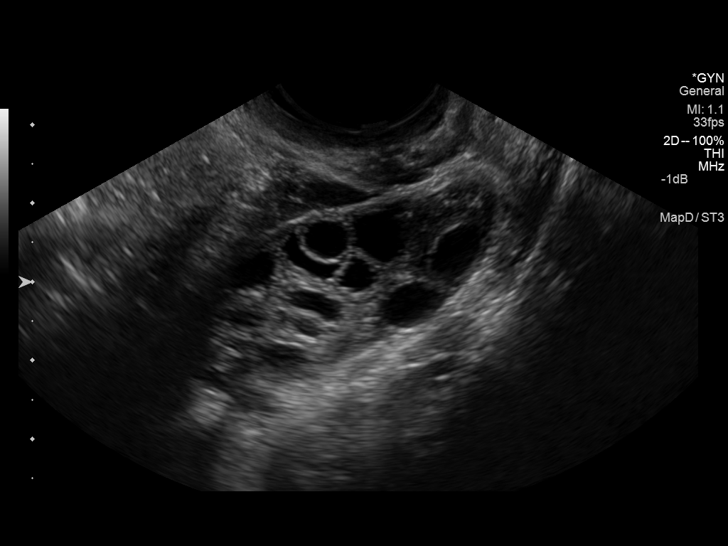
[im 71/71]
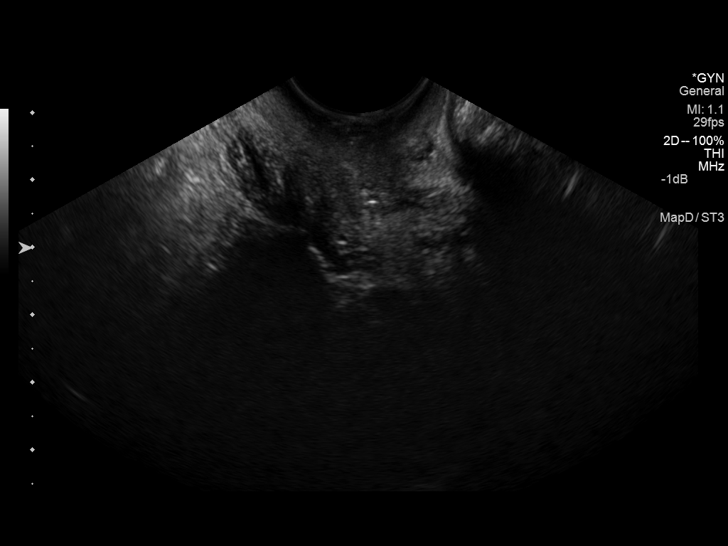

[13 of 25 positions shown; findings below may reference images not displayed]

FINDINGS: Uterus

Measurements: 6.6 x 2.6 x 4.6 cm. No fibroids or other mass
visualized. Question septate uterus

Endometrium

Thickness: 1.4 mm on the right and 1.9 mm on the left within normal
limits.. No focal abnormality visualized.

Right ovary

Measurements: 4.0 x 2.4 x 2.7 cm. Normal appearance/no adnexal mass.

Left ovary

Measurements: 3.5 x 2 x 3.3 cm. Normal appearance/no adnexal mass.

Multiple normal appearing bilateral ovarian follicles are noted.

Pulsed Doppler evaluation of both ovaries demonstrates normal
low-resistance arterial and venous waveforms. There is no evidence
of ovarian torsion. Mild congested blood vessels in left adnexal.
Mild left pelvic congestion cannot be excluded.

Other findings

Small pelvic free fluid is noted.
IMPRESSION: 1. Normal size uterus. Question septate uterus. Normal endometrial
stripe thickness.
2. Unremarkable bilateral ovary. Bilateral normal appearing ovarian
follicles. No evidence of ovarian torsion. Bilateral normal ovarian
flow.
3. Mild congested left adnexal vessels suspicious for mild left
pelvic congestion.

## 2023-10-11 ENCOUNTER — Other Ambulatory Visit: Payer: Self-pay

## 2023-10-11 ENCOUNTER — Encounter (HOSPITAL_BASED_OUTPATIENT_CLINIC_OR_DEPARTMENT_OTHER): Payer: Self-pay | Admitting: Obstetrics and Gynecology

## 2023-10-11 NOTE — Progress Notes (Signed)
Spoke w/ via phone for pre-op interview---pt Lab needs dos---- none per anesthesia, surgery orders req dr April Manson epic ib        Lab results------ COVID test -----patient states asymptomatic no test needed Arrive at -------1000 10-20-2023 NPO after MN NO Solid Food.  Clear liquids from MN until---900 Med rec completed Medications to take morning of surgery -----none Diabetic medication -----n/a Patient instructed no nail polish to be worn day of surgery Patient instructed to bring photo id and insurance card day of surgery Patient aware to have Driver (ride ) / caregiver    for 24 hours after surgery - [place] partner  Patient Special Instructions -----none Pre-Op special Instructions -----none Patient verbalized understanding of instructions that were given at this phone interview. Patient denies chest pain, sob, fever, cough at the interview.   [facility] cardiology dr Maryelizabeth Rowan [facility] 06-06-2020 f/u prn Echo 08-29-2020 [facility] epic

## 2023-10-20 ENCOUNTER — Other Ambulatory Visit: Payer: Self-pay

## 2023-10-20 ENCOUNTER — Encounter (HOSPITAL_BASED_OUTPATIENT_CLINIC_OR_DEPARTMENT_OTHER): Payer: Self-pay | Admitting: Obstetrics and Gynecology

## 2023-10-20 ENCOUNTER — Other Ambulatory Visit: Payer: Self-pay | Admitting: Obstetrics and Gynecology

## 2023-10-20 ENCOUNTER — Ambulatory Visit (HOSPITAL_COMMUNITY): Payer: BC Managed Care – PPO

## 2023-10-20 ENCOUNTER — Ambulatory Visit (HOSPITAL_BASED_OUTPATIENT_CLINIC_OR_DEPARTMENT_OTHER): Payer: BC Managed Care – PPO | Admitting: Anesthesiology

## 2023-10-20 ENCOUNTER — Ambulatory Visit (HOSPITAL_BASED_OUTPATIENT_CLINIC_OR_DEPARTMENT_OTHER)
Admission: RE | Admit: 2023-10-20 | Discharge: 2023-10-20 | Disposition: A | Discharge status: Home / Self Care | Payer: BC Managed Care – PPO | Attending: Obstetrics and Gynecology | Admitting: Obstetrics and Gynecology

## 2023-10-20 ENCOUNTER — Encounter (HOSPITAL_BASED_OUTPATIENT_CLINIC_OR_DEPARTMENT_OTHER)
Admission: RE | Disposition: A | Discharge status: Home / Self Care | Payer: Self-pay | Source: Home / Self Care | Attending: Obstetrics and Gynecology

## 2023-10-20 ENCOUNTER — Ambulatory Visit (HOSPITAL_BASED_OUTPATIENT_CLINIC_OR_DEPARTMENT_OTHER): Payer: Self-pay | Admitting: Anesthesiology

## 2023-10-20 DIAGNOSIS — O3431 Maternal care for cervical incompetence, first trimester: Secondary | ICD-10-CM | POA: Diagnosis present

## 2023-10-20 DIAGNOSIS — Z3A12 12 weeks gestation of pregnancy: Secondary | ICD-10-CM | POA: Insufficient documentation

## 2023-10-20 DIAGNOSIS — Z01818 Encounter for other preprocedural examination: Secondary | ICD-10-CM

## 2023-10-20 HISTORY — DX: Other complications of anesthesia, initial encounter: T88.59XA

## 2023-10-20 HISTORY — DX: Dyspnea, unspecified: R06.00

## 2023-10-20 HISTORY — PX: CERCLAGE LAPAROSCOPIC ABDOMINAL: SHX5769

## 2023-10-20 LAB — CBC
HCT: 40 % (ref 36.0–46.0)
Hemoglobin: 13.8 g/dL (ref 12.0–15.0)
MCH: 29.9 pg (ref 26.0–34.0)
MCHC: 34.5 g/dL (ref 30.0–36.0)
MCV: 86.8 fL (ref 80.0–100.0)
Platelets: 247 10*3/uL (ref 150–400)
RBC: 4.61 MIL/uL (ref 3.87–5.11)
RDW: 12 % (ref 11.5–15.5)
WBC: 7.8 10*3/uL (ref 4.0–10.5)
nRBC: 0 % (ref 0.0–0.2)

## 2023-10-20 LAB — TYPE AND SCREEN
ABO/RH(D): O POS
Antibody Screen: NEGATIVE

## 2023-10-20 SURGERY — CERCLAGE, CERVIX, LAPAROSCOPIC
Anesthesia: General | Site: Pelvis

## 2023-10-20 MED ORDER — ROCURONIUM BROMIDE 10 MG/ML (PF) SYRINGE
PREFILLED_SYRINGE | INTRAVENOUS | Status: DC | PRN
  Administered 2023-10-20: 60 mg via INTRAVENOUS

## 2023-10-20 MED ORDER — INDOMETHACIN 25 MG PO CAPS: 25.0000 mg | ORAL_CAPSULE | Freq: Three times a day (TID) | ORAL | 0 refills | Status: AC

## 2023-10-20 MED ORDER — ATROPINE SULFATE 1 MG/ML IV SOLN
INTRAVENOUS | Status: DC | PRN
  Administered 2023-10-20: 1 mg via INTRAVENOUS

## 2023-10-20 MED ORDER — LACTATED RINGERS IV SOLN
INTRAVENOUS | Status: DC
Start: 2023-10-20 — End: 2023-10-20

## 2023-10-20 MED ORDER — ONDANSETRON HCL 4 MG PO TABS: 4.0000 mg | ORAL_TABLET | Freq: Every day | ORAL | 1 refills | Status: AC | PRN

## 2023-10-20 MED ORDER — CEFAZOLIN SODIUM-DEXTROSE 2-4 GM/100ML-% IV SOLN
INTRAVENOUS | Status: AC
  Filled 2023-10-20: qty 100

## 2023-10-20 MED ORDER — FENTANYL CITRATE (PF) 100 MCG/2ML IJ SOLN
INTRAMUSCULAR | Status: DC | PRN
  Administered 2023-10-20 (×2): 50 ug via INTRAVENOUS

## 2023-10-20 MED ORDER — SODIUM CHLORIDE 0.9 % IR SOLN
Status: DC | PRN
  Administered 2023-10-20: 1000 mL

## 2023-10-20 MED ORDER — ONDANSETRON HCL 4 MG/2ML IJ SOLN
INTRAMUSCULAR | Status: DC | PRN
  Administered 2023-10-20: 4 mg via INTRAVENOUS

## 2023-10-20 MED ORDER — NEOSTIGMINE METHYLSULFATE 10 MG/10ML IV SOLN
INTRAVENOUS | Status: DC | PRN
  Administered 2023-10-20: 3 mg via INTRAVENOUS

## 2023-10-20 MED ORDER — DEXTROSE 5 % IV SOLN: 2000.0000 mg | Freq: Once | INTRAVENOUS | Status: DC

## 2023-10-20 MED ORDER — INDOMETHACIN 50 MG RE SUPP
50.0000 mg | Freq: Once | RECTAL | Status: AC
  Administered 2023-10-20: 50 mg via RECTAL
  Filled 2023-10-20: qty 1

## 2023-10-20 MED ORDER — ONDANSETRON HCL 4 MG/2ML IJ SOLN
INTRAMUSCULAR | Status: AC
  Filled 2023-10-20: qty 2

## 2023-10-20 MED ORDER — POVIDONE-IODINE 10 % EX SWAB: 2.0000 | Freq: Once | CUTANEOUS | Status: DC

## 2023-10-20 MED ORDER — FENTANYL CITRATE (PF) 100 MCG/2ML IJ SOLN
INTRAMUSCULAR | Status: AC
  Filled 2023-10-20: qty 2

## 2023-10-20 MED ORDER — ACETAMINOPHEN 500 MG PO TABS
1000.0000 mg | ORAL_TABLET | Freq: Once | ORAL | Status: AC
  Administered 2023-10-20: 1000 mg via ORAL

## 2023-10-20 MED ORDER — PROPOFOL 10 MG/ML IV BOLUS
INTRAVENOUS | Status: DC | PRN
  Administered 2023-10-20: 200 mg via INTRAVENOUS

## 2023-10-20 MED ORDER — LIDOCAINE 2% (20 MG/ML) 5 ML SYRINGE
INTRAMUSCULAR | Status: DC | PRN
  Administered 2023-10-20: 80 mg via INTRAVENOUS

## 2023-10-20 MED ORDER — NEOSTIGMINE METHYLSULFATE 3 MG/3ML IV SOSY
PREFILLED_SYRINGE | INTRAVENOUS | Status: AC
  Filled 2023-10-20: qty 3

## 2023-10-20 MED ORDER — ACETAMINOPHEN 500 MG PO TABS
ORAL_TABLET | ORAL | Status: AC
  Filled 2023-10-20: qty 2

## 2023-10-20 MED ORDER — ATROPINE SULFATE 0.4 MG/ML IV SOLN
INTRAVENOUS | Status: AC
  Filled 2023-10-20: qty 3

## 2023-10-20 MED ORDER — LIDOCAINE HCL (PF) 2 % IJ SOLN
INTRAMUSCULAR | Status: AC
  Filled 2023-10-20: qty 5

## 2023-10-20 MED ORDER — LACTATED RINGERS IV SOLN: INTRAVENOUS | Status: DC

## 2023-10-20 MED ORDER — BUPIVACAINE HCL 0.25 % IJ SOLN
INTRAMUSCULAR | Status: DC | PRN
  Administered 2023-10-20: 17 mL

## 2023-10-20 MED ORDER — ATROPINE SULFATE 1 MG/10ML IJ SOSY
PREFILLED_SYRINGE | INTRAMUSCULAR | Status: AC
  Filled 2023-10-20: qty 10

## 2023-10-20 MED ORDER — ROCURONIUM BROMIDE 10 MG/ML (PF) SYRINGE
PREFILLED_SYRINGE | INTRAVENOUS | Status: AC
  Filled 2023-10-20: qty 10

## 2023-10-20 MED ORDER — FENTANYL CITRATE (PF) 100 MCG/2ML IJ SOLN: 25.0000 ug | INTRAMUSCULAR | Status: DC | PRN

## 2023-10-20 MED ORDER — STERILE WATER FOR IRRIGATION IR SOLN
Status: DC | PRN
  Administered 2023-10-20: 500 mL

## 2023-10-20 MED ORDER — INDOMETHACIN 50 MG RE SUPP
RECTAL | Status: DC | PRN
  Administered 2023-10-20: 50 mg via RECTAL

## 2023-10-20 MED ORDER — TRAMADOL-ACETAMINOPHEN 37.5-325 MG PO TABS: 1.0000 | ORAL_TABLET | Freq: Four times a day (QID) | ORAL | 0 refills | Status: AC | PRN

## 2023-10-20 MED ORDER — PROPOFOL 10 MG/ML IV BOLUS
INTRAVENOUS | Status: AC
  Filled 2023-10-20: qty 20

## 2023-10-20 MED ORDER — CEFAZOLIN SODIUM-DEXTROSE 2-4 GM/100ML-% IV SOLN
2.0000 g | Freq: Once | INTRAVENOUS | Status: AC
  Administered 2023-10-20: 2 g via INTRAVENOUS

## 2023-10-20 SURGICAL SUPPLY — 44 items
ADH SKN CLS APL DERMABOND .7 (GAUZE/BANDAGES/DRESSINGS) ×1
APL SRG 38 LTWT LNG FL B (MISCELLANEOUS)
APPLICATOR ARISTA FLEXITIP XL (MISCELLANEOUS) IMPLANT
CABLE HIGH FREQUENCY MONO STRZ (ELECTRODE) IMPLANT
COVER BACK TABLE 60X90IN (DRAPES) IMPLANT
COVER MAYO STAND STRL (DRAPES) IMPLANT
DERMABOND ADVANCED .7 DNX12 (GAUZE/BANDAGES/DRESSINGS) ×1 IMPLANT
DEVICE TROCAR PUNCTURE CLOSURE (ENDOMECHANICALS) ×2 IMPLANT
DRAPE SURG IRRIG POUCH 19X23 (DRAPES) ×1 IMPLANT
DURAPREP 26ML APPLICATOR (WOUND CARE) ×1 IMPLANT
ELECT REM PT RETURN 9FT ADLT (ELECTROSURGICAL) ×1
ELECTRODE REM PT RTRN 9FT ADLT (ELECTROSURGICAL) ×1 IMPLANT
GAUZE 4X4 16PLY ~~LOC~~+RFID DBL (SPONGE) ×1 IMPLANT
GLOVE BIO SURGEON STRL SZ8 (GLOVE) ×2 IMPLANT
GOWN STRL REUS W/TWL LRG LVL3 (GOWN DISPOSABLE) ×2 IMPLANT
HEMOSTAT ARISTA ABSORB 3G PWDR (HEMOSTASIS) IMPLANT
IRRIG SUCT STRYKERFLOW 2 WTIP (MISCELLANEOUS) ×1
IRRIGATION SUCT STRKRFLW 2 WTP (MISCELLANEOUS) ×1 IMPLANT
IV NS 1000ML (IV SOLUTION) ×1
IV NS 1000ML BAXH (IV SOLUTION) IMPLANT
KIT PINK PAD W/HEAD ARE REST (MISCELLANEOUS) ×1
KIT PINK PAD W/HEAD ARM REST (MISCELLANEOUS) ×2 IMPLANT
KIT TURNOVER CYSTO (KITS) ×1 IMPLANT
MANIPULATOR UTERINE 4.5 ZUMI (MISCELLANEOUS) IMPLANT
NDL INSUFFLATION 14GA 120MM (NEEDLE) ×1 IMPLANT
NEEDLE INSUFFLATION 14GA 120MM (NEEDLE) ×1
PACK LAPAROSCOPY BASIN (CUSTOM PROCEDURE TRAY) ×1 IMPLANT
RETRACTOR LAPSCP 12X46 CVD (ENDOMECHANICALS) IMPLANT
RTRCTR LAPSCP 12X46 CVD (ENDOMECHANICALS) ×1
SET TUBE SMOKE EVAC HIGH FLOW (TUBING) ×1 IMPLANT
SHEARS HARMONIC 36 ACE (MISCELLANEOUS) ×1 IMPLANT
SLEEVE SCD COMPRESS KNEE MED (STOCKING) ×1 IMPLANT
SLEEVE Z-THREAD 5X100MM (TROCAR) ×1 IMPLANT
SUT MERSILENE 5MM BP 1 12 (SUTURE) ×1 IMPLANT
SUT MNCRL AB 4-0 PS2 18 (SUTURE) ×1 IMPLANT
SUT SILK 2 0 30 PSL (SUTURE) ×1 IMPLANT
SUT VIC AB 2-0 UR6 27 (SUTURE) ×1 IMPLANT
TAPE UMBILICAL 1/8X30 (MISCELLANEOUS) IMPLANT
TOWEL OR 17X24 6PK STRL BLUE (TOWEL DISPOSABLE) ×1 IMPLANT
TRAY FOLEY W/BAG SLVR 14FR LF (SET/KITS/TRAYS/PACK) ×1 IMPLANT
TROCAR Z-THREAD BLADED 11X100M (TROCAR) IMPLANT
TROCAR Z-THREAD BLADED 5X100MM (TROCAR) ×1 IMPLANT
TROCAR Z-THREAD FIOS 12X100MM (TROCAR) IMPLANT
WARMER LAPAROSCOPE (MISCELLANEOUS) ×1 IMPLANT

## 2023-10-20 NOTE — Anesthesia Procedure Notes (Signed)
Procedure Name: Intubation Date/Time: 10/20/2023 2:42 PM  Performed by: Shiva Karis D, CRNAPre-anesthesia Checklist: Patient identified, Emergency Drugs available, Suction available and Patient being monitored Patient Re-evaluated:Patient Re-evaluated prior to induction Oxygen Delivery Method: Circle system utilized Preoxygenation: Pre-oxygenation with 100% oxygen Induction Type: IV induction Ventilation: Mask ventilation without difficulty Laryngoscope Size: Mac and 3 Grade View: Grade I Tube type: Oral Tube size: 7.0 mm Number of attempts: 1 Airway Equipment and Method: Stylet and Oral airway Placement Confirmation: ETT inserted through vocal cords under direct vision, positive ETCO2 and breath sounds checked- equal and bilateral Secured at: 21 cm Tube secured with: Tape Dental Injury: Teeth and Oropharynx as per pre-operative assessment

## 2023-10-20 NOTE — Op Note (Signed)
OPERATIVE NOTE  Preoperative diagnosis: Cervical insufficiency, intrauterine pregnancy at 12 weeks Postoperative diagnosis: Cervical insufficiency, intrauterine pregnancy at 12 weeks Procedure: Laparoscopy, transabdominal cervical isthmic cerclage, intraoperative ultrasound guidance Anesthesia: Gen. Endotracheal Surgeon:Yarisa Lynam April Manson, MD Complications: None  Estimated blood loss: 200 cc Findings: On exam under anesthesia the cervix was closed. On laparoscopy liver edge gallbladder and appendix appeared normal as did the diaphragm surfaces. The uterus was gravid with a fundal indentation, suggesting of a mllerian fusion anomaly.  Both ovaries and tubes appeared normal. There were stellate lesions of endometriosis at the insertion of the uterosacral ligaments, posterior cul-de-sac peritoneum and ovarian fossae. Intraoperative ultrasound transabdominally and transvaginally showed a singleton intrauterine pregnancy at length consistent with 12 weeks. There was fetal cardiac activity and movements.  Description of the procedure: The patient was placed in dorsal supine position and general endotracheal anesthesia was given. She was then placed in lithotomy position and the abdomen was prepped and draped inside manner. A Foley catheter was inserted into the bladder. An intraumbilical 5 mm vertical skin incision was made after preemptive anesthesia of all proposed incisions with 0.25% bupivacaine. A Verress needle was inserted. A pneumoperitoneum was created with carbon dioxide. A 5 mm trocar and then a corresponding laparoscope with 30 angle was inserted and video laparoscopy was started.  Under direct visualization 2 more 5 mm lower quadrant incisions were made and corresponding trochars were placed. A fifth 12 mm trocar was placed in the left upper quadrant for the Endopaddle retractor.  Above findings were noted. A #5 Mersilene tape was prepared by cutting the needles off just at the swaged and and  creating a loop of 2-0 silk at each end to allow carrying the end of the Mersilene tape through the tissue during the cerclage. This Mersilene tape was then dropped into the posterior cul-de-sac. The patient was placed in Trendelenburg position and the uterus was gently pressed posteriorly and superiorly with the EndoPaddle retractor while the bladder flap was created with the harmonic scalpel. After blunt and sharp dissection the cervical used junction could be well visualized, despite the fibrotic nature of the visceral peritoneum due to endometriosis or surgery for endometriosis. Next a stab wound incision was made 1 cm from the midline immediately suprapubically and an Endo Close ligature carrier was passed through the abdominal wall and pain and at the right side of the cervical isthmic junction. At this point of passing the tip of the Endo Close through the parametrium the uterus was hammock on the EndoPaddle device and lifted anteriorly, allowing visualization of the posterior lower uterine segment. The tip of the Endo Close device was brought out posteriorly just above the medial insertion point of the right uterosacral ligaments onto the uterus. 1 end of the Mersilene tape was grasped with the Endo Close device and brought out anteriorly. The same steps were repeated with a new Endo Close device on the left anterior aspect of the cervico-isthmic junction, immediately medial to the uterine blood vessels, and the other end of the Mersilene tape was grasped with the ligature carrier device and brought out anteriorly. Intraoperative transvaginal ultrasound guidance confirmed correct extra chorionic placement of the cerclage, as well as the fetal viability, even though he noted that the right side of the stitch was about a centimeter higher, incorporating a portion of myometrium. When the cerclage suture was tied with a surgeon's knot followed by 4 additional knots under transvaginal ultrasound guidance again  the correct placement was confirmed. The resulting cervical cerclage  produced a cervical length of 3.6 cm. The AP and transverse diameters of the cerclage by ultrasound was 22 mm. The ends of the Mersilene tape was trimmed off and removed from the pelvis. Good hemostasis was insured. The pelvis was copiously irrigated and aspirated. The gas was allowed to escape. A 0 Vicryl deep subcutaneous suture was placed on the 12 mm incision. All skin incisions were approximated with 4-0 Monocryl in subcuticular stitches.  Dermabond was applied to the skin. The patient tolerated the procedure well and was transferred to recovery room in satisfactory condition.    Fermin Schwab, MD

## 2023-10-20 NOTE — H&P (Signed)
Kristy Sanchez is a 27 y.o. female , originally referred to me by Dr. , for laparoscopic TAC for CI. She is [redacted] weeks pregnant.   Pertinent Gynecological History: Menses: flow is excessive with use of 3 pads or tampons on heaviest days Bleeding: dysfunctional uterine bleeding Contraception: none DES exposure: denies Blood transfusions: none Sexually transmitted diseases: no past history Previous GYN Procedures:   Last pap: normal   Menstrual History: Menarche age: 57 No LMP recorded.    Past Medical History:  Diagnosis Date   Complication of anesthesia    slow to awaken after  laparoscopy   Dyspnea    with heavy exertion   Dysrhythmia    recent tachycardia due to dehydration   Uterus bilocularis    bicornuate uterus diagnosed by OB-Gyn based on Abdominal CT and ultrasound   UTI (urinary tract infection) 04/23/2014   85K CFU's of Klebsiella, treated with Augmentin x 10 days                     Past Surgical History:  Procedure Laterality Date    csections x 2     DENTAL SURGERY  03/23/2014   wisdom teeth extraction   LAPAROSCOPY N/A 06/28/2015   Procedure: LAPAROSCOPY DIAGNOSTIC WITH LYSIS OF ADHESIONS AND FULGERATION OF ENDOMETRIOSIS;  Surgeon: Reva Bores, MD;  Location: [facility];  Service: Gynecology;  Laterality: N/A;   septum removed from uterus               Family History  Problem Relation Age of Onset   Dementia Father    Depression Father    Cancer Paternal Uncle    Cancer Maternal Grandmother        leukemia   Heart disease Paternal Grandmother        arrhythmia   Diabetes Paternal Grandfather    No hereditary disease.  No cancer of breast, ovary, uterus.  Social History   Socioeconomic History   Marital status: Single    Spouse name: Not on file   Number of children: Not on file   Years of education: Not on file   Highest education level: Not on file  Occupational History   Not on file  Tobacco Use   Smoking status: Former    Current  packs/day: 0.00    Types: Cigarettes    Quit date: 07/16/2015    Years since quitting: 8.2   Smokeless tobacco: Never   Tobacco comments:    trying to stop  Vaping Use   Vaping status: Never Used  Substance and Sexual Activity   Alcohol use: No    Alcohol/week: 0.0 standard drinks of alcohol   Drug use: No   Sexual activity: Yes    Partners: Male  Other Topics Concern   Not on file  Social History Narrative   ** Merged History Encounter **       Lives alone.  Graduated from high school in January 2015.  Working at [employer], plans to start to college in the future, major in psychology.     Social Determinants of Health   Financial Resource Strain: Not on file  Food Insecurity: Not on file  Transportation Needs: Not on file  Physical Activity: Not on file  Stress: Not on file  Social Connections: Unknown (10/18/2023)   [facility]   Social Network    Social Network: Not on file  Intimate Partner Violence: Not At Risk (10/18/2023)   Received from [facility]  HITS    Over the last 12 months how often did your partner physically hurt you?: 1    Over the last 12 months how often did your partner insult you or talk down to you?: 1    Over the last 12 months how often did your partner threaten you with physical harm?: 1    Over the last 12 months how often did your partner scream or curse at you?: 1    Allergies  Allergen Reactions   Hydrocodone Nausea And Vomiting   Vicodin [Hydrocodone-Acetaminophen] Nausea And Vomiting    No current facility-administered medications on file prior to encounter.   Current Outpatient Medications on File Prior to Encounter  Medication Sig Dispense Refill   cephALEXin (KEFLEX) 500 MG capsule Take 500 mg by mouth 3 (three) times daily.     Prenatal Vit-Fe Fumarate-FA (PRENATAL VITAMINS PLUS) 27-1 MG TABS Take 1 tablet by mouth daily. 30 tablet 11     Review of Systems  Constitutional: Negative.   HENT: Negative.    Eyes: Negative.   Respiratory: Negative.   Cardiovascular: Negative.   Gastrointestinal: Negative.   Genitourinary: Negative.   Musculoskeletal: Negative.   Skin: Negative.   Neurological: Negative.   Endo/Heme/Allergies: Negative.   Psychiatric/Behavioral: Negative.      Physical Exam  BP 113/71   Pulse 75   Temp 97.8 F (36.6 C) (Oral)   Resp 16   Ht 5\' 7"  (1.702 m)   Wt 70.7 kg   LMP 07/28/2023   SpO2 98%   BMI 24.40 kg/m  Constitutional: She is oriented to person, place, and time. She appears well-developed and well-nourished.  HENT:  Head: Normocephalic and atraumatic.  Nose: Nose normal.  Mouth/Throat: Oropharynx is clear and moist. No oropharyngeal exudate.  Eyes: Conjunctivae normal and EOM are normal. Pupils are equal, round, and reactive to light. No scleral icterus.  Neck: Normal range of motion. Neck supple. No tracheal deviation present. No thyromegaly present.  Cardiovascular: Normal rate.   Respiratory: Effort normal and breath sounds normal.  GI: Soft. Bowel sounds are normal. She exhibits no distension and no mass. There is no tenderness.  Lymphadenopathy:    She has no cervical adenopathy.  Neurological: She is alert and oriented to person, place, and time. She has normal reflexes.  Skin: Skin is warm.  Psychiatric: She has a normal mood and affect. Her behavior is normal. Judgment and thought content normal.    Assessment/Plan:  Cervical insufficiency with 12 week intrauterine pregnancy Preoperative for laparoscopic transabdominal cervicoisthmic cerclage Benefits and risks of the proposed procedure were discussed with the patient and her family member again.  Bowel prep instructions were given.  All of patient's questions were answered.  She verbalized understanding.  She knows that she will need a cesarean delivery for this and future pregnancies.  Fermin Schwab, MD

## 2023-10-20 NOTE — Interval H&P Note (Signed)
History and Physical Interval Note:  10/20/2023 2:30 PM  Kristy Sanchez  has presented today for surgery, with the diagnosis of cervical insufficiency.  The various methods of treatment have been discussed with the patient and family. After consideration of risks, benefits and other options for treatment, the patient has consented to  Procedure(s): CERCLAGE LAPAROSCOPIC ABDOMINAL (N/A) as a surgical intervention.  The patient's history has been reviewed, patient examined, no change in status, stable for surgery.  I have reviewed the patient's chart and labs.  Questions were answered to the patient's satisfaction.     Belky Mundo April Manson

## 2023-10-20 NOTE — Anesthesia Postprocedure Evaluation (Signed)
Anesthesia Post Note  Patient: Kristy Sanchez  Procedure(s) Performed: CERCLAGE LAPAROSCOPIC ABDOMINAL (Pelvis)     Patient location during evaluation: PACU Anesthesia Type: General Level of consciousness: awake and alert Pain management: pain level controlled Vital Signs Assessment: post-procedure vital signs reviewed and stable Respiratory status: spontaneous breathing, nonlabored ventilation and respiratory function stable Cardiovascular status: blood pressure returned to baseline and stable Postop Assessment: no apparent nausea or vomiting Anesthetic complications: no   No notable events documented.  Last Vitals:  Vitals:   10/20/23 1627 10/20/23 1645  BP: 101/63   Pulse: 76   Resp:    Temp: 37.2 C 37.2 C  SpO2: 100%     Last Pain:  Vitals:   10/20/23 1645  TempSrc:   PainSc: 3                  Lowella Curb

## 2023-10-20 NOTE — Transfer of Care (Signed)
Immediate Anesthesia Transfer of Care Note  Patient: Kristy Sanchez  Procedure(s) Performed: CERCLAGE LAPAROSCOPIC ABDOMINAL (Pelvis)  Patient Location: PACU  Anesthesia Type:General  Level of Consciousness: awake, alert , and oriented  Airway & Oxygen Therapy: Patient Spontanous Breathing and Patient connected to nasal cannula oxygen  Post-op Assessment: Report given to RN and Post -op Vital signs reviewed and stable  Post vital signs: Reviewed and stable  Last Vitals:  Vitals Value Taken Time  BP 101/63 10/20/23 1627  Temp    Pulse 72 10/20/23 1627  Resp 13 10/20/23 1627  SpO2 100 % 10/20/23 1627  Vitals shown include unfiled device data.  Last Pain:  Vitals:   10/20/23 1009  TempSrc: Oral         Complications: No notable events documented.

## 2023-10-20 NOTE — Anesthesia Preprocedure Evaluation (Addendum)
Anesthesia Evaluation  Patient identified by MRN, date of birth, ID band Patient awake    Reviewed: Allergy & Precautions, NPO status , Patient's Chart, lab work & pertinent test results  History of Anesthesia Complications (+) PROLONGED EMERGENCE and history of anesthetic complications  Airway Mallampati: II  TM Distance: >3 FB Neck ROM: Full   Comment: Previous grade I view with Miller 2, easy mask Dental  (+) Dental Advisory Given   Pulmonary neg shortness of breath, neg sleep apnea, neg COPD, neg recent URI, former smoker   Pulmonary exam normal breath sounds clear to auscultation       Cardiovascular negative cardio ROS  Rhythm:Regular Rate:Normal     Neuro/Psych negative neurological ROS     GI/Hepatic negative GI ROS, Neg liver ROS,,,  Endo/Other  negative endocrine ROS    Renal/GU negative Renal ROS     Musculoskeletal   Abdominal   Peds  Hematology negative hematology ROS (+) Lab Results      Component                Value               Date                      WBC                      7.4                 06/18/2015                HGB                      15.0                06/18/2015                HCT                      42.1                06/18/2015                MCV                      85.6                06/18/2015                PLT                      218                 06/18/2015              Anesthesia Other Findings   Reproductive/Obstetrics (+) Pregnancy (~12 weeks) Bicornuate uterus, cervical insufficiency                             Anesthesia Physical Anesthesia Plan  ASA: 2  Anesthesia Plan: General   Post-op Pain Management:    Induction: Intravenous  PONV Risk Score and Plan: 3 and Ondansetron, Dexamethasone and Treatment may vary due to age or medical condition  Airway Management Planned: Oral ETT  Additional Equipment:   Intra-op Plan:    Post-operative Plan: Extubation in OR  Informed  Consent: I have reviewed the patients History and Physical, chart, labs and discussed the procedure including the risks, benefits and alternatives for the proposed anesthesia with the patient or authorized representative who has indicated his/her understanding and acceptance.     Dental advisory given  Plan Discussed with: CRNA and Anesthesiologist  Anesthesia Plan Comments: (Risks of general anesthesia discussed including, but not limited to, sore throat, hoarse voice, chipped/damaged teeth, injury to vocal cords, nausea and vomiting, allergic reactions, lung infection, heart attack, stroke, and death. Discussed risks of GA on fetus. Will avoid appropriate medications. All questions answered. )        Anesthesia Quick Evaluation

## 2023-10-20 NOTE — Discharge Instructions (Signed)
You were given Tylenol by mouth prior to your surgery today, please do not take any more Tylenol until after 5pm today  Keep your incisions sites clean and dry, the liquid adhesive applied by your doctor in surgery will slowly dry up and fall off When you take a shower tomorrow, please do not rub those areas, but simply pat dry them lightly. Do not take a hot bath until your MD allows you to.

## 2023-10-21 LAB — RPR: RPR Ser Ql: NONREACTIVE

## 2023-10-22 ENCOUNTER — Encounter (HOSPITAL_BASED_OUTPATIENT_CLINIC_OR_DEPARTMENT_OTHER): Payer: Self-pay | Admitting: Obstetrics and Gynecology
